# Patient Record
Sex: Female | Born: 1935 | Race: White | Hispanic: No | Smoking: Never smoker
Health system: Southern US, Community
[De-identification: ages and names within clinical notes are randomized; demographics above are authoritative.]

## PROBLEM LIST (undated history)

## (undated) DIAGNOSIS — M8580 Other specified disorders of bone density and structure, unspecified site: Secondary | ICD-10-CM

## (undated) DIAGNOSIS — K219 Gastro-esophageal reflux disease without esophagitis: Secondary | ICD-10-CM

## (undated) DIAGNOSIS — I10 Essential (primary) hypertension: Secondary | ICD-10-CM

## (undated) DIAGNOSIS — Z87442 Personal history of urinary calculi: Secondary | ICD-10-CM

## (undated) DIAGNOSIS — J189 Pneumonia, unspecified organism: Secondary | ICD-10-CM

## (undated) DIAGNOSIS — Z9889 Other specified postprocedural states: Secondary | ICD-10-CM

## (undated) DIAGNOSIS — R112 Nausea with vomiting, unspecified: Secondary | ICD-10-CM

## (undated) DIAGNOSIS — K30 Functional dyspepsia: Secondary | ICD-10-CM

## (undated) HISTORY — DX: Pneumonia, unspecified organism: J18.9

## (undated) HISTORY — PX: VAGINAL HYSTERECTOMY: SUR661

## (undated) HISTORY — PX: EYE SURGERY: SHX253

## (undated) HISTORY — DX: Personal history of urinary calculi: Z87.442

## (undated) HISTORY — PX: APPENDECTOMY: SHX54

## (undated) HISTORY — PX: CHOLECYSTECTOMY: SHX55

## (undated) HISTORY — DX: Other specified disorders of bone density and structure, unspecified site: M85.80

## (undated) HISTORY — PX: BLADDER SUSPENSION: SHX72

## (undated) HISTORY — PX: COLONOSCOPY: SHX174

## (undated) HISTORY — PX: WISDOM TOOTH EXTRACTION: SHX21

---

## 1998-11-11 ENCOUNTER — Other Ambulatory Visit: Admission: RE | Admit: 1998-11-11 | Discharge: 1998-11-11 | Payer: Self-pay | Admitting: Obstetrics and Gynecology

## 2001-12-19 ENCOUNTER — Other Ambulatory Visit: Admission: RE | Admit: 2001-12-19 | Discharge: 2001-12-19 | Payer: Self-pay | Admitting: Obstetrics and Gynecology

## 2002-05-29 ENCOUNTER — Ambulatory Visit (HOSPITAL_COMMUNITY): Admission: RE | Admit: 2002-05-29 | Discharge: 2002-05-29 | Payer: Self-pay | Admitting: Gastroenterology

## 2003-10-12 ENCOUNTER — Encounter: Admission: RE | Admit: 2003-10-12 | Discharge: 2003-10-12 | Payer: Self-pay | Admitting: Internal Medicine

## 2003-10-13 ENCOUNTER — Encounter: Admission: RE | Admit: 2003-10-13 | Discharge: 2003-10-13 | Payer: Self-pay | Admitting: Internal Medicine

## 2003-10-28 ENCOUNTER — Encounter (INDEPENDENT_AMBULATORY_CARE_PROVIDER_SITE_OTHER): Payer: Self-pay | Admitting: Specialist

## 2003-10-28 ENCOUNTER — Observation Stay (HOSPITAL_COMMUNITY): Admission: RE | Admit: 2003-10-28 | Discharge: 2003-10-28 | Payer: Self-pay | Admitting: Surgery

## 2005-03-21 ENCOUNTER — Ambulatory Visit (HOSPITAL_COMMUNITY): Admission: RE | Admit: 2005-03-21 | Discharge: 2005-03-21 | Payer: Self-pay | Admitting: Urology

## 2007-03-26 ENCOUNTER — Other Ambulatory Visit: Admission: RE | Admit: 2007-03-26 | Discharge: 2007-03-26 | Payer: Self-pay | Admitting: Obstetrics & Gynecology

## 2007-10-17 DIAGNOSIS — J189 Pneumonia, unspecified organism: Secondary | ICD-10-CM

## 2007-10-17 HISTORY — DX: Pneumonia, unspecified organism: J18.9

## 2008-06-16 DIAGNOSIS — M8580 Other specified disorders of bone density and structure, unspecified site: Secondary | ICD-10-CM

## 2008-06-16 HISTORY — DX: Other specified disorders of bone density and structure, unspecified site: M85.80

## 2011-03-03 NOTE — Op Note (Signed)
[name], [name]               ACCOUNT NO.:  [id number]   MEDICAL RECORD NO.:  [id number]          PATIENT TYPE:  AMB   LOCATION:  DAY                          FACILITY:  [facility]   PHYSICIAN:  Jamison Neighbor, M.D.  DATE OF BIRTH:  Jan 22, 1935   DATE OF PROCEDURE:  03/21/2005  DATE OF DISCHARGE:                                 OPERATIVE REPORT   PREOPERATIVE DIAGNOSES:  Prolapsing cystocele with possible stress urinary  incontinence.   POSTOPERATIVE DIAGNOSIS:  Prolapsing cystocele.   PROCEDURE:  Anterior repair of cystocele including mesh.   SECONDARY PROCEDURE:  Cystoscopy.   SURGEON:  Jamison Neighbor, M.D.   ANESTHESIA:  General.   COMPLICATIONS:  None.   DRAINS:  Foley catheter.   BRIEF HISTORY:  This 76 year old female has a prolapsing cystocele that  extends out beyond the introitus.  This has not been associated with any  stress urinary incontinence as it does appear that she has fairly good  urethral support;  however, there is certainly some concern that after the  cystocele is repaired, she may develop some leakage problems.  She does not  have a lot in the way of vault prolapse.  There is a modest rectocele and no  enterocele.  The patient has been seen and evaluated by GYN and was referred  for treatment of the cystocele with the thought that a mesh repair might  give her a better long-term result.  The patient is quite active, and all  effort needs to be made to give her as permanent a repair as possible  without any loss of vaginal structure.  After careful consideration, the  patient is to undergo mesh repair.  If it does appear that she may developed  some stress incontinence, she will have a sling placed at the same time.  She gave full informed consent.   DESCRIPTION OF PROCEDURE:  After successful induction of general anesthesia,  the patient was placed in the dorsal lithotomy position, prepped with  Betadine, and draped in the usual sterile fashion.   Careful examination  shows that she does have a central defect that is prolapsing out beyond the  introitus.  The vault appears to be well supported.  There is a modest  rectocele and no enterocele.  An incision was made in the anterior vaginal  mucosa beginning at the bladder neck and extending back to the cardinal  ligaments.  This was done after the entire area had been infiltrated with  Marcaine and epinephrine.  Flaps of mucosa were raised bilaterally extending  all the way back to the endopelvic fascia.  This was entered to allow  placement of the mesh system.  The central defect was then closed by pulling  the fascia across the midline with a series of mattress sutures.  The  patient had stab incisions made in the groin crease at the level of the  clitoris and then also approximately 3.5 cm inferiorly.  The four arms of  the Perigee mesh repair were then brought out through the small stab  incision by passing needles through the upper  and lower portion of the  obturator fossa.  Cystoscopy was then performed with a 12-degree and 70-  degree lenses.  No tumors or stones could be seen.  The bladder neck  actually appeared pretty well supported with good coaptation of the mucosa.  The patient had not be injured in any way by the passage of the needles.  The mesh was then attached, and all four wings were brought out, and this  very nicely elevated the cystocele.  The tail on the end was trimmed up  slightly and was attached down at the cardinal ligaments. Additional  redundancy was removed.  The mucosa was trimmed and then closed with a  series of 2-0 Vicryl sutures.  Several layers were done to make sure that  there was good coverage over the mesh.  The entire area was irrigated with  antibiotic solution.  The final inspection showed there appeared to good  support for the bladder.  This did not appear to be over-corrected.  There  was no angulation noted.  There was a flat, thin base  of the bladder.  There  was no sign of any mesh coming out through the incision.  There was a modest  rectocele, but not enough to warrant additional surgery.  There was no loss  of vaginal length and still good vaginal size.  The Foley catheter was left  in place.  Packing was inserted.  The four arms of the device were then  trimmed at the stab incisions, and they were then closed with Dermabond.  The patient tolerated the procedure and was taken to the recovery room in  good condition.  She will be sent home with a prescription for Cipro as well  as for Lorcet 10 and will return to see me in the office in two weeks' time.  If the patient is unable to urinate today, she will go home with a Foley  catheter in place.  The patient certainly will be advised that if additional  prolapse such as rectocele should occur, she will discuss this with Dr.  Tresa Res, and this can be repaired at a later data.  Should stress  incontinence occur at a later date, she is also a candidate for a simple  sling as well.  These, however, were not felt to be necessary today as a  site specific repair was felt to be most appropriate.     _______________    RJE/MEDQ  D:  03/21/2005  T:  03/21/2005  Job:  [id number]   cc:   Edwena Felty. Romine, M.D.  [address]  [place]  [place] [address]  Fax: [phone]   Thora Lance, M.D.  [address]  [place]  [place] [address]  Fax: [phone]

## 2011-03-03 NOTE — Op Note (Signed)
NAMEAMBERLE, [name]                         ACCOUNT NO.:  [id number]   MEDICAL RECORD NO.:  [id number]                   PATIENT TYPE:  AMB   LOCATION:  DAY                                  [facility]ACILITY[facility]  [facility]   PHYSICIAN:  Currie Paris, M.D.           DATE OF BIRTH:  Jul 04, 1935   DATE OF PROCEDURE:  10/27/2002  DATE OF DISCHARGE:                                 OPERATIVE REPORT   OFFICE MEDICAL RECORD NUMBER:  [id number]   PREOPERATIVE DIAGNOSIS:  Chronic calculus cholecystitis.   POSTOPERATIVE DIAGNOSIS:  Chronic calculus cholecystitis.   OPERATION:  Laparoscopic cholecystectomy with operative cholangiogram.   SURGEON:  Currie Paris, M.D.   ASSISTANT:  Dr. Ginette Pitman   ANESTHESIA:  General endotracheal.   CLINICAL HISTORY:  This patient is a 76 year old, been having intermittent  bouts of abdominal pain, including diarrhea with very foul-smelling stool.  She was initially thought not to have gallbladder problems but on further  evaluation, she was found to probably have had a small episode of  pancreatitis and gallstones noted as well.  After her CT scan, she developed  an allergic reaction about one week later which was thought possibly  secondary to her contrast, but that could not be conclusively shown, and she  resolved almost immediately on a short course of steroids.  She was admitted  electively for cholecystectomy.   DESCRIPTION OF PROCEDURE:  The patient seen in the holding area and had no  further questions.  She was taken to the operating room and after  satisfactory general endotracheal anesthesia had been obtained, the abdomen  was prepped and draped.  Marcaine 0.25% plain was used for each incision.  The umbilical incision made, the fascia opened, the peritoneal cavity  entered, and a pursestring suture placed.  The Hasson was introduced and the  abdomen insufflated to 15.  I attempted to visualize the ovaries, but I  could not get a good look at  those.  Preoperatively, there had been some  noted ovarian cysts, but these had been stable over a long period of time.   The patient was placed in reverse Trendelenburg and tilted to the left.  Additional trocars were placed with a 10-11 in the epigastrium and two 5s  laterally.  We had used those actually to try to visualize the pelvis.  The  patient then had the gallbladder grasped and retracted over the liver.  Once  the peritoneum over the common duct, cystic duct area was open, I could  identify the cystic duct and saw both the anterior and posterior branch of  the cystic artery and made a nice window in the triangle of Calot.  The  cystic duct was clipped at its junction with the gallbladder and opened.  An  operative cholangiography was done using a Cook catheter, and this appeared  to be normal, although there was such good flow into duodenum, we did not  get good back-filling into the hepatic radicles but did see both the right  and left branch.   The catheter was removed and the cystic duct clipped three times and  divided.  The two arteries were double clipped on the stay side and once on  the gallbladder side and divided.  The gallbladder was removed from below to  above, placed in a bag, and then brought out the umbilical port.  We  irrigated and made sure everything looked dry and suctioned out the  remaining irrigant.  The lateral ports were removed under direct vision.  The umbilical port was closed with a pursestring.  The abdomen was deflated  through the epigastric port.  Skin was closed with 4-0 Monocryl and  subcuticular Dermabond.   The patient tolerated the procedure well.  There were no operative  complications, and all counts were correct.                                               Currie Paris, M.D.    CJS/MEDQ  D:  10/28/2003  T:  10/28/2003  Job:  [id number]   cc:   Thora Lance, M.D.  [address]  [place]  [place] [address]   Fax: [phone]

## 2011-03-03 NOTE — Op Note (Signed)
   [name], [name]                         ACCOUNT NO.:  [id number]   MEDICAL RECORD NO.:  [id number]                   PATIENT TYPE:  AMB   LOCATION:  ENDO                                 FACILITY:  [facility]   PHYSICIAN:  Charolett Bumpers, M.D.             DATE OF BIRTH:  October 09, 1935   DATE OF PROCEDURE:  05/29/2002  DATE OF DISCHARGE:                                 OPERATIVE REPORT   PROCEDURE:  Screening colonoscopy.   REFERRING PHYSICIAN:  Thora Lance, M.D.   INDICATIONS FOR PROCEDURE:  Ms. [name] is a 76 year old female born  05-12-1935.  Ms. [name] underwent a colonoscopy performed by Dr. Sheryn Bison approximately five years ago.  Her father had colon cancer.   ENDOSCOPIST:  Charolett Bumpers, M.D.   PREMEDICATION:  Versed 10 mg, Fentanyl 50 mcg.   ENDOSCOPE:  Olympus pediatric colonoscope.   PROCEDURE:  After obtaining confirmed consent, Ms. [name] was placed in the  left lateral decubitus position.  I administered intravenous Fentanyl and  intravenous  Versed to achieve conscious sedation for the procedure.  The  patient's blood pressure, oxygen saturation, and cardiac rhythm were  monitored throughout the procedure and documented in the medical record.  Anal inspection was normal.  Digital rectal exam was normal.  The Olympus  pediatric video colonoscope was introduced into the rectum and easily  advanced to the cecum.  Colonic preparation for the exam today was  excellent.   Rectum normal.  Sigmoid colon and descending colon:  Left colonic diverticulosis.  Splenic flexure normal.  Transverse colon normal.  Hepatic flexure normal.  Ascending colon normal.  Cecum and ileocecal valve normal.   ASSESSMENT:  Left colonic diverticulosis, otherwise normal proctocolonoscopy  to the cecum.  No endoscopic evidence for the presence of colorectal  neoplasia.   RECOMMENDATIONS:  Consider repeat screening colonoscopy in approximately  five  years.                                              Charolett Bumpers, M.D.   MKJ/MEDQ  D:  05/29/2002  T:  05/30/2002  Job:  [id number]   cc:   Thora Lance, M.D.

## 2011-05-31 ENCOUNTER — Other Ambulatory Visit: Payer: Self-pay | Admitting: Internal Medicine

## 2011-05-31 DIAGNOSIS — G8929 Other chronic pain: Secondary | ICD-10-CM

## 2011-06-02 ENCOUNTER — Other Ambulatory Visit: Payer: Self-pay

## 2012-07-10 ENCOUNTER — Other Ambulatory Visit: Payer: Self-pay | Admitting: Internal Medicine

## 2012-10-24 ENCOUNTER — Other Ambulatory Visit: Payer: Self-pay | Admitting: *Deleted

## 2013-07-03 ENCOUNTER — Encounter: Payer: Self-pay | Admitting: Obstetrics & Gynecology

## 2013-07-23 ENCOUNTER — Other Ambulatory Visit (HOSPITAL_COMMUNITY): Payer: Self-pay

## 2013-07-24 ENCOUNTER — Other Ambulatory Visit (HOSPITAL_COMMUNITY): Payer: Self-pay

## 2013-08-08 ENCOUNTER — Other Ambulatory Visit (HOSPITAL_COMMUNITY): Payer: Self-pay

## 2013-09-04 ENCOUNTER — Encounter: Payer: Self-pay | Admitting: Obstetrics & Gynecology

## 2013-09-04 DIAGNOSIS — M8580 Other specified disorders of bone density and structure, unspecified site: Secondary | ICD-10-CM | POA: Insufficient documentation

## 2013-09-05 ENCOUNTER — Ambulatory Visit (INDEPENDENT_AMBULATORY_CARE_PROVIDER_SITE_OTHER): Payer: Medicare Other | Admitting: Obstetrics & Gynecology

## 2013-09-05 ENCOUNTER — Encounter: Payer: Self-pay | Admitting: Obstetrics & Gynecology

## 2013-09-05 VITALS — BP 122/80 | HR 60 | Resp 16 | Ht 67.25 in | Wt 159.0 lb

## 2013-09-05 DIAGNOSIS — R141 Gas pain: Secondary | ICD-10-CM

## 2013-09-05 DIAGNOSIS — R140 Abdominal distension (gaseous): Secondary | ICD-10-CM

## 2013-09-05 DIAGNOSIS — Z01419 Encounter for gynecological examination (general) (routine) without abnormal findings: Secondary | ICD-10-CM

## 2013-09-05 MED ORDER — VITAMIN D (ERGOCALCIFEROL) 1.25 MG (50000 UNIT) PO CAPS: 50000.0000 [IU] | ORAL_CAPSULE | ORAL | Status: DC

## 2013-09-05 NOTE — Progress Notes (Signed)
Patient ID: [name], female   DOB: Mar 12, 1935, 78 y.o.   MRN: [id number]

## 2013-09-05 NOTE — Progress Notes (Signed)
78 y.o. [id number] WWF here for annual exam.  Husband passed this past year.  Grieving appropriately.  Children are in [place], [place], and [place].  No vaginal bleeding.  Dr. Valentina Lucks is PCP.  Sees him once yearly.  Pt reports some change in bowel movements/habits over last year.  Also having some trouble with bloating.  Feels stress related.  PCP wants her to have a colonoscopy again.  She really doesn't want to do this.  Off HRT.  No LMP recorded. Patient has had a hysterectomy.          Sexually active: no  The current method of family planning is status post hysterectomy and post menopausal status.    Exercising: yes  Home exercise routine includes pilates and walk. Smoker:  no  Health Maintenance: Pap:  7/10, normal History of abnormal Pap:  no MMG:  10/14 Colonoscopy:  2008, [facility] BMD:   9/09, -1.7/-1.0 TDaP:  PCP Screening Labs: PCP, Hb today: PCP, Urine today: PCP   reports that she has never smoked. She does not have any smokeless tobacco history on file. She reports that she drinks about 1.5 ounces of alcohol per week. She reports that she does not use illicit drugs.  Past Medical History  Diagnosis Date  . Osteopenia 06/2008  . Pneumonia 2009    Past Surgical History  Procedure Laterality Date  . Appendectomy    . Cholecystectomy    . Bladder suspension    . Abdominal hysterectomy      TVH--still has ovaries    Current Outpatient Prescriptions  Medication Sig Dispense Refill  . CALCIUM PO Take by mouth. Patient takes [phone] mg      . losartan (COZAAR) 50 MG tablet Take 1 tablet by mouth daily.      . Multiple Vitamins-Minerals (MULTIVITAMIN PO) Take by mouth.      . Vitamin D, Ergocalciferol, (DRISDOL) 50000 UNITS CAPS capsule Take 50,000 Units by mouth once a week.       No current facility-administered medications for this visit.    Family History  Problem Relation Age of Onset  . Cancer Father     ?colon ca  . Diabetes Brother     ROS:   Pertinent items are noted in HPI.  Otherwise, a comprehensive ROS was negative.  Exam:   BP 122/80  Pulse 60  Resp 16  Ht 5' 7.25" (1.708 m)  Wt 159 lb (72.122 kg)  BMI 24.72 kg/m2  Weight change: @WEIGHTCHANGE @ Height:   Height: 5' 7.25" (170.8 cm)  Ht Readings from Last 3 Encounters:  09/05/13 5' 7.25" (1.708 m)    General appearance: alert, cooperative and appears stated age Head: Normocephalic, without obvious abnormality, atraumatic Neck: no adenopathy, supple, symmetrical, trachea midline and thyroid normal to inspection and palpation Lungs: clear to auscultation bilaterally Breasts: normal appearance, no masses or tenderness Heart: regular rate and rhythm Abdomen: soft, non-tender; bowel sounds normal; no masses,  no organomegaly Extremities: extremities normal, atraumatic, no cyanosis or edema Skin: Skin color, texture, turgor normal. No rashes or lesions Lymph nodes: Cervical, supraclavicular, and axillary nodes normal. No abnormal inguinal nodes palpated Neurologic: Grossly normal   Pelvic: External genitalia:  no lesions              Urethra:  normal appearing urethra with no masses, tenderness or lesions              Bartholins and Skenes: normal  Vagina: normal appearing vagina with normal color and discharge, no lesions, small rectocele present              Cervix: absent              Pap taken: no Bimanual Exam:  Uterus:  uterus absent              Adnexa: normal adnexa and no mass, fullness, tenderness               Rectovaginal: Confirms               Anus:  normal sphincter tone, no lesions  A:  Well Woman with normal exam Vit D deficiency Small rectocele Appropriate grief reaction after death of spouse PMP, off HRT Osteopenia  P:   Mammogram yearly pap smear not obtained Return for PUS Vit D 50K every other week.  Rx to pharmacy. return annually or prn  An After Visit Summary was printed and given to the patient.

## 2013-09-05 NOTE — Patient Instructions (Signed)

## 2013-09-17 ENCOUNTER — Telehealth: Payer: Self-pay | Admitting: Obstetrics & Gynecology

## 2013-09-17 NOTE — Telephone Encounter (Signed)
Advised patient of the quote of benefits we received from [employer]. Patient is agreeable.

## 2013-09-17 NOTE — Telephone Encounter (Signed)
Call to patient and offered to  scheduled PUS even though insurance benefits have  not yet been checked.  Patient is unavailable every Thursday afternoon so insistent on morning appointment. Advised we will make an exception and schedule in another provider slot to accommodate. PUS 10-02-13 at 0930.  Routing to provider for final review. Patient agreeable to disposition. Will close encounter

## 2013-09-17 NOTE — Telephone Encounter (Signed)
Patient was in on the 21st of november and said she was told she would get a call about scheduling a ultrasound. She hasnt heard yet and wanted someone to call her

## 2013-10-02 ENCOUNTER — Ambulatory Visit (INDEPENDENT_AMBULATORY_CARE_PROVIDER_SITE_OTHER): Payer: Medicare Other

## 2013-10-02 ENCOUNTER — Encounter: Payer: Self-pay | Admitting: Obstetrics & Gynecology

## 2013-10-02 ENCOUNTER — Ambulatory Visit (INDEPENDENT_AMBULATORY_CARE_PROVIDER_SITE_OTHER): Payer: Medicare Other | Admitting: Obstetrics & Gynecology

## 2013-10-02 VITALS — BP 138/84 | HR 64 | Ht 66.25 in | Wt 160.5 lb

## 2013-10-02 DIAGNOSIS — N83209 Unspecified ovarian cyst, unspecified side: Secondary | ICD-10-CM

## 2013-10-02 DIAGNOSIS — N839 Noninflammatory disorder of ovary, fallopian tube and broad ligament, unspecified: Secondary | ICD-10-CM

## 2013-10-02 DIAGNOSIS — R141 Gas pain: Secondary | ICD-10-CM

## 2013-10-02 DIAGNOSIS — R140 Abdominal distension (gaseous): Secondary | ICD-10-CM

## 2013-10-02 DIAGNOSIS — N838 Other noninflammatory disorders of ovary, fallopian tube and broad ligament: Secondary | ICD-10-CM

## 2013-10-02 NOTE — Progress Notes (Signed)
78 y.o.Marriedfemale here for a pelvic ultrasound.  Having bloating symptoms which she feels are maybe stress related.  PUS recommended and she is here for this today.  H/O TVH many years ago.  Wants to discuss her insomnia.  Has tried multiple OTC products as well as prescription medications without success.  They all seem to "zip me up" and make her more aware and agitated.  Doesn't really want my recommendation just wants to talk about it.  She has been on several different medications.    Another thing she wants to discuss is her "chocolate pudding" bowel movements.  She has had this issue for years.  Colonoscopy was 2008 at [facility].  Did try Align and had some improvement but then went the other way with constipation.  She has stopped that now.  No LMP recorded. Patient has had a hysterectomy.  Sexually active:  No, widowed  Contraception: hysterectomy  FINDINGS: UTERUS: surgically absent EMS: n/a ADNEXA:   Left ovary 0.9 x 1.0 x 2.0cm with 2.0 thin-walled cyst with low level echoes, avascular   Right ovary 2.0 x 0.8 x 0.9cm with 4.1 cm cyst containing thin septation but avascular CUL DE SAC: neg for free fluid  Images reviewed and findings discussed.  Bilateral cysts do appear benign and overall not concerning for cancer.  Discussed with pt differing guidelines for ovary removal based on size.  I feel very safe to watch this and would recommend repeat PUS 3-4 months.  Removal via laparoscopy also discussed.  She wants to consider options and will let me know.    Assessment:  Bilateral ovarian cysts, right 4cm, concerns regarding consistency of bowel movements. Plan: conservative follow up with repeat PUS 3-4 months vs removal now. Recommended pt follow up with GI regarding stool consistency concerns.   Insomnia.  Dr. Valentina Lucks has given several sleep meds that make her "zipped up" "chocolate pudding" bowel movements [employer]

## 2013-10-03 LAB — CA 125: CA 125: 9.1 U/mL (ref 0.0–30.2)

## 2013-10-14 ENCOUNTER — Telehealth: Payer: Self-pay | Admitting: Obstetrics & Gynecology

## 2013-10-14 NOTE — Telephone Encounter (Signed)
Message left to return call to [employer] at [phone].   Appears that patient has received a phone call from [facility] 20 minutes after she called in. Returned call and left message to call back if needed further assistance.

## 2013-10-14 NOTE — Telephone Encounter (Signed)
Patient is calling about results from blood work from appt on 10/02/13 wants to be called back about it.

## 2013-10-15 ENCOUNTER — Telehealth: Payer: Self-pay | Admitting: *Deleted

## 2013-10-15 DIAGNOSIS — N83209 Unspecified ovarian cyst, unspecified side: Secondary | ICD-10-CM | POA: Insufficient documentation

## 2013-10-15 NOTE — Telephone Encounter (Signed)
Call to patient as follow-up with patient on her preference for surgery. Patient states she sees no real advantage to keeping ovaries and wished to proceed with scheduling surgery at first available date. Will schedule and call her back.

## 2013-10-15 NOTE — Telephone Encounter (Signed)
Message copied by Alisa Graff on Wed Oct 15, 2013 11:50 AM ------      Message from: Jerene Bears      Created: Wed Oct 15, 2013  7:34 AM       Pt has a 4cm ovarian cyst and h/o TVH.  May want ovaries out.  Can you call?  Ok to schedule.  I would prefer with Dr. Edward Jolly as assistant.  I can do this on a Tues if ok with Dr. Edward Jolly.            CC: Lorrene Reid ------

## 2013-10-17 NOTE — Telephone Encounter (Signed)
Patient calling back today regarding date for surgery. States after talking with multiple family members, she thinks she may be jumping to surgery too fast and she has decided she will wait till the repeat PUS in march and then decide if wants to have surgery.  PUS is already scheduled for 01-01-14.  Surgery was also already scheduled for 11-03-13 as patient requested on 10-15-13 so will notify hospital to cancel case. Beverly at [place] scheduling notified to cancel case.  Routing to provider for final review. Patient agreeable to disposition. Will close encounter

## 2013-11-03 ENCOUNTER — Encounter (HOSPITAL_COMMUNITY): Admission: RE | Payer: Self-pay | Source: Ambulatory Visit

## 2013-11-03 ENCOUNTER — Ambulatory Visit (HOSPITAL_COMMUNITY)
Admission: RE | Admit: 2013-11-03 | Payer: Medicare Other | Source: Ambulatory Visit | Admitting: Obstetrics & Gynecology

## 2013-11-03 SURGERY — SALPINGO-OOPHORECTOMY, BILATERAL, LAPAROSCOPIC
Anesthesia: General | Laterality: Bilateral

## 2013-11-10 ENCOUNTER — Telehealth: Payer: Self-pay | Admitting: Obstetrics & Gynecology

## 2013-11-10 DIAGNOSIS — N838 Other noninflammatory disorders of ovary, fallopian tube and broad ligament: Secondary | ICD-10-CM

## 2013-11-10 NOTE — Telephone Encounter (Signed)
Routing to Dr. Sabra Heck for review for patient request for second opinion.    Last visit notes from Dr. Sabra Heck 10/02/13:  Images reviewed and findings discussed. Bilateral cysts do appear benign and overall not concerning for cancer. Discussed with pt differing guidelines for ovary removal based on size. I feel very safe to watch this and would recommend repeat PUS 3-4 months. Removal via laparoscopy also discussed. She wants to consider options and will let me know.

## 2013-11-10 NOTE — Telephone Encounter (Signed)
Spoke with pt.  She doesn't really want surgery but this is worrying her so she feels she needs a second opinion.  Please send to Dr. Fermin Schwab or Dr. Hale Bogus appt isn't weeks and weeks away.  Order placed.  For patient's schedule--Mon, Tue, Thurs afternoons are bad.  Almost all mornings are fine.    CC: [name]

## 2013-11-10 NOTE — Telephone Encounter (Signed)
Patient said she had a scan in December for ovarian cyst and doesn't want to wait until march to get checked again wants to know where dr [name] would recommend to go and get a second opinion.

## 2013-11-11 NOTE — Telephone Encounter (Signed)
Received call from [facility] at [place]. Dr. Alycia Rossetti is in office today. She will have Dr. Alycia Rossetti review and call back with appointment or Dr. Elenora Gamma recommendation.

## 2013-11-11 NOTE — Telephone Encounter (Signed)
Left message [facility] at [phone] for appointment.

## 2013-11-12 NOTE — Telephone Encounter (Signed)
Melissa from Dr. Creed Copper office called and advised that Dr. Alycia Rossetti has done chart review and agrees with Dr. Ammie Ferrier recommendations. Patient may schedule appointment for second opinion if she would like to.  Called and spoke with patient. I gave her the message from Dr. Creed Copper office. Patient states she is agreeable then to both Dr. Sabra Heck and Dr. Creed Copper opinon. Patient has PUS scheduled for 3/19 and was reminded of appointment. Patient states that she has some new bloating, wondering if it may be from ovarian cyst or from recent constipation. Patient will call back if worsens.  Routing to Dr. Sabra Heck for final review.

## 2013-11-25 ENCOUNTER — Telehealth: Payer: Self-pay | Admitting: Obstetrics & Gynecology

## 2013-11-25 DIAGNOSIS — N83209 Unspecified ovarian cyst, unspecified side: Secondary | ICD-10-CM

## 2013-11-25 DIAGNOSIS — N838 Other noninflammatory disorders of ovary, fallopian tube and broad ligament: Secondary | ICD-10-CM

## 2013-11-25 NOTE — Telephone Encounter (Signed)
Spoke with patient. She is requesting Dr. Ammie Ferrier opinion on possibly moving her ultrasound earlier and not waiting until scheduled for 01/01/14.  She states that she is having the opportunity to travel to [place] on 02/13/14 and she would like to go but does not want to have issues with cyst while traveling or if she needs to have surgery would like to have done earlier rather than later.  Advised I would send a message to Dr. Sabra Heck to get her opinion. Patient declined office visit at this time to discuss with Dr. Sabra Heck. Advised I would call her back with message.

## 2013-11-25 NOTE — Telephone Encounter (Signed)
This is fine.  Would 2 or 3 weeks earlier help?  If needed surgery, would schedule within 2-3 weeks and recovery would not be anymore than a month.

## 2013-11-25 NOTE — Telephone Encounter (Signed)
Message left to return call to Redford Behrle at [phone].    

## 2013-11-25 NOTE — Telephone Encounter (Signed)
Pt would like to speak with nurse regarding the ovarian cysts that she have.

## 2013-11-26 NOTE — Telephone Encounter (Signed)
Spoke with patient, she will call back when she has her calendar in hand to schedule new u/s appt.

## 2013-11-26 NOTE — Telephone Encounter (Signed)
Patient called back. R/s U/S appointment to 12/11/13. Pelvic U/S scheduled and patient aware/agreeable to time.  Patient verbalized understanding of the U/S appointment cancellation policy. Advised will need to cancel within 72 business hours (3 business days) or will have $100.00 no show fee placed to account.   Encounter closed.

## 2013-12-11 ENCOUNTER — Telehealth: Payer: Self-pay | Admitting: Obstetrics & Gynecology

## 2013-12-11 ENCOUNTER — Other Ambulatory Visit: Payer: Medicare Other

## 2013-12-11 ENCOUNTER — Other Ambulatory Visit: Payer: Medicare Other | Admitting: Obstetrics & Gynecology

## 2013-12-12 NOTE — Telephone Encounter (Signed)
Note not needed 

## 2013-12-16 ENCOUNTER — Other Ambulatory Visit: Payer: Self-pay | Admitting: *Deleted

## 2013-12-16 DIAGNOSIS — N838 Other noninflammatory disorders of ovary, fallopian tube and broad ligament: Secondary | ICD-10-CM

## 2013-12-18 ENCOUNTER — Encounter: Payer: Self-pay | Admitting: Obstetrics and Gynecology

## 2013-12-18 ENCOUNTER — Ambulatory Visit (INDEPENDENT_AMBULATORY_CARE_PROVIDER_SITE_OTHER): Payer: Medicare Other

## 2013-12-18 ENCOUNTER — Ambulatory Visit (INDEPENDENT_AMBULATORY_CARE_PROVIDER_SITE_OTHER): Payer: Medicare Other | Admitting: Obstetrics and Gynecology

## 2013-12-18 VITALS — BP 140/86 | HR 70 | Ht 66.25 in | Wt 161.6 lb

## 2013-12-18 DIAGNOSIS — N83209 Unspecified ovarian cyst, unspecified side: Secondary | ICD-10-CM

## 2013-12-18 DIAGNOSIS — N839 Noninflammatory disorder of ovary, fallopian tube and broad ligament, unspecified: Secondary | ICD-10-CM

## 2013-12-18 DIAGNOSIS — N83201 Unspecified ovarian cyst, right side: Secondary | ICD-10-CM

## 2013-12-18 DIAGNOSIS — N83202 Unspecified ovarian cyst, left side: Principal | ICD-10-CM

## 2013-12-18 DIAGNOSIS — N838 Other noninflammatory disorders of ovary, fallopian tube and broad ligament: Secondary | ICD-10-CM

## 2013-12-18 NOTE — Progress Notes (Signed)
Patient ID: [name], female   DOB: [birth year], 79 y.o.   MRN: [id number] GYNECOLOGY  VISIT   HPI: 79 y.o.   Married  Caucasian  female   [phone] with No LMP recorded. Patient has had a hysterectomy.   here for  Pelvic ultrasound for follow up of bilateral ovarian cysts. CA125 10/02/13 - 9.1  Patient is traveling to [place] in May for one month and wants to know that the cysts won't rupture while she is traveling.   GYNECOLOGIC HISTORY: No LMP recorded. Patient has had a hysterectomy. Contraception:  hysterectomy Menopausal hormone therapy: none        OB History   Grav Para Term Preterm Abortions TAB SAB Ect Mult Living   3 3 3       3          Patient Active Problem List   Diagnosis Date Noted  . Other and unspecified ovarian cyst 10/15/2013  . Osteopenia     Past Medical History  Diagnosis Date  . Osteopenia 06/2008  . Pneumonia 2009    Past Surgical History  Procedure Laterality Date  . Appendectomy    . Cholecystectomy    . Bladder suspension    . Abdominal hysterectomy      TVH--still has ovaries    Current Outpatient Prescriptions  Medication Sig Dispense Refill  . CALCIUM PO Take by mouth. Patient takes [phone] mg      . losartan (COZAAR) 50 MG tablet Take 1 tablet by mouth daily.      . Multiple Vitamins-Minerals (MULTIVITAMIN PO) Take by mouth.      . Vitamin D, Ergocalciferol, (DRISDOL) 50000 UNITS CAPS capsule Take 1 capsule (50,000 Units total) by mouth every 14 (fourteen) days.  6 capsule  4   No current facility-administered medications for this visit.     ALLERGIES: Review of patient's allergies indicates no known allergies.  Family History  Problem Relation Age of Onset  . Cancer Father     ?colon ca  . Diabetes Brother     History   Social History  . Marital Status: Married    Spouse Name: N/A    Number of Children: N/A  . Years of Education: N/A   Occupational History  . Not on file.   Social History Main Topics  . Smoking  status: Never Smoker   . Smokeless tobacco: Not on file  . Alcohol Use: 1.5 oz/week    3 drink(s) per week  . Drug Use: No  . Sexual Activity: No     Comment: TVH   Other Topics Concern  . Not on file   Social History Narrative  . No narrative on file    ROS:  Pertinent items are noted in HPI.  PHYSICAL EXAMINATION:    BP 140/86  Pulse 70  Ht 5' 6.25" (1.683 m)  Wt 161 lb 9.6 oz (73.301 kg)  BMI 25.88 kg/m2     General appearance: alert, cooperative and appears stated age   Ultrasound - absent uterus.  Right ovarian tissue not visualized.  Patient with difficulty with exam.  Bowel noted in the region.  Right ovarian cyst 50 x 45 x 43 mm - mean 46 mm (previously 41 mm mean on 10/02/13).  Left ovarian tissue not visualized on transvaginal exam but seen transabdominally 21 x 18 x 15 mm - mean 18 mm (no real change since 10/02/13).  Images reviewed from 10/02/13 as well with patient. Right ovary with thin septation.  Normal dopplers at that time.   CT of pelvis in 2004 - 1.3 cm simple right ovarian cyst and 1.4 cm simple left ovarian cyst.   ASSESSMENT  Bilateral ovarian cysts.  Minimal change in the growth of the right ovarian cyst.  I suspect the patient may have cystadenomas.  PLAN  I discussed observation versus laparoscopic bilateral salpingo-oophorectomy.  Patient prefers to do observational management.   I recommend repeat ultrasound and consultation in 3 months.  Counseled on signs and symptoms of torsion.   An After Visit Summary was printed and given to the patient.  _15_____ minutes face to face time of which over 50% was spent in counseling.

## 2013-12-18 NOTE — Patient Instructions (Signed)
Ovarian Torsion  The ovaries are female reproductive organs that produce eggs. Ovarian torsion is when an ovary becomes twisted and cuts off its own blood supply. This can occur at any age. If an ovary is twisted, it cannot get blood and the ovary swells. It is a painful medical emergency. It must be treated quickly. If too much time has past, blood flow to the ovary may not be restored and the ovary may have to be removed.  CAUSES  Torsion can happen in an ovary that is normal size. However, most of the time it occurs in an ovary that is enlarged. An ovary can become enlarged because of:  · Harmless (benign) tumors on the ovaries.  · Cancerous tumors.  · Ovarian cysts, which are fluid-filled sacs.  · Normal pregnancy.  · A pregnancy that occurs outside the uterus (ectopic pregnancy).  RISK FACTORS  Risk factors are things that increase the likelihood of this condition happening. The risk factors include:  · Having fallopian tubes that are longer than normal.  · Having ovaries that are larger than normal.  · Taking fertility medicine to become pregnant.  · Having had surgery in the pelvic area.  SYMPTOMS  · Sudden pain in the lower abdomen, usually on one side only.  · Pelvic pain that starts after exercise.  · Pelvic pain that gets worse over time.  · Severe pelvic pain that comes and goes.  · Pelvic pain that spreads into the lower back or thigh.  · Nausea and vomiting along with pelvic pain.  DIAGNOSIS  Your caregiver will take a history and perform a physical exam. He or she may be able to feel an enlarged ovary. Your caregiver may order some further tests, which include:  · A pregnancy test.  · Imaging tests, such as pelvic Doppler ultrasonography, that measures blood flow, CT scan, or MRI.  Your caregiver may also perform a diagnositic laparoscopic exam. A small surgical cut (incision) will be made in your abdomen. Then, a small lighted telescope is put through the opening. This allows your caregiver to  clearly see your ovary and fallopian tube.   TREATMENT  Surgery is needed when an ovary becomes twisted. It is best to do this 8 hours or less after the ovary becomes twisted. Laparoscopic ovarian torsion surgery is done to try to untwist the ovary. Sometimes, a large incision has to be made in the abdomen (laparotomy) to relieve the ovary. If the ovary cannot be untwisted, the ovary will have to be surgically removed during a procedure called salpingo-oophorectomy.  Document Released: 09/21/2011 Document Revised: 12/25/2011 Document Reviewed: 09/21/2011  ExitCare® Patient Information ©2014 [employer].

## 2013-12-18 NOTE — Progress Notes (Signed)
Ultrasound reviewed.

## 2014-01-01 ENCOUNTER — Other Ambulatory Visit: Payer: Medicare Other | Admitting: Obstetrics & Gynecology

## 2014-01-01 ENCOUNTER — Other Ambulatory Visit: Payer: Medicare Other

## 2014-01-01 ENCOUNTER — Telehealth: Payer: Self-pay | Admitting: Obstetrics and Gynecology

## 2014-01-01 NOTE — Telephone Encounter (Signed)
Patient calling to scheduled June PUS. Scheduled for 6/18 with Dr. Sabra Heck. Pelvic U/S scheduled and patient aware/agreeable to time.  Patient verbalized understanding of the U/S appointment cancellation policy. Advised will need to cancel within 72 business hours (3 business days) or will have $100.00 no show fee placed to account.     Routing to provider for final review. Patient agreeable to disposition. Will close encounter

## 2014-01-01 NOTE — Telephone Encounter (Signed)
Pt calling to schedule a procedure. Not sure what the name of it is.

## 2014-01-02 ENCOUNTER — Telehealth: Payer: Self-pay | Admitting: Obstetrics & Gynecology

## 2014-01-02 NOTE — Telephone Encounter (Signed)
Called patient. Rescheduled PUS for 06.04.2015. Advised patient of cancellation policy and cancellation fee. Patient agreeable.

## 2014-01-02 NOTE — Telephone Encounter (Signed)
Patient cancelled her ultrasound appointment for 04/02/14. She is hoping to reschedule for 03/19/14 if possible.

## 2014-03-19 ENCOUNTER — Ambulatory Visit (INDEPENDENT_AMBULATORY_CARE_PROVIDER_SITE_OTHER): Payer: Medicare Other

## 2014-03-19 ENCOUNTER — Ambulatory Visit (INDEPENDENT_AMBULATORY_CARE_PROVIDER_SITE_OTHER): Payer: Medicare Other | Admitting: Obstetrics & Gynecology

## 2014-03-19 VITALS — BP 118/82 | Ht 66.25 in | Wt 161.0 lb

## 2014-03-19 DIAGNOSIS — N83202 Unspecified ovarian cyst, left side: Principal | ICD-10-CM

## 2014-03-19 DIAGNOSIS — N83209 Unspecified ovarian cyst, unspecified side: Secondary | ICD-10-CM

## 2014-03-19 DIAGNOSIS — N83201 Unspecified ovarian cyst, right side: Secondary | ICD-10-CM

## 2014-03-19 NOTE — Progress Notes (Signed)
Pt needed to leave before being seen.  Seen phone note.

## 2014-03-27 ENCOUNTER — Telehealth: Payer: Self-pay | Admitting: Obstetrics & Gynecology

## 2014-03-27 DIAGNOSIS — N838 Other noninflammatory disorders of ovary, fallopian tube and broad ligament: Secondary | ICD-10-CM

## 2014-03-27 NOTE — Telephone Encounter (Signed)
Spoke with patient. Advised would send a message over to [place] regarding ultrasound results and phone call request. Advised Dr.Miller is currently seeing patient's but I would send her a message and as soon as she has availability she will reach out to patient to discuss results. Patient states "That is fine. There is no rush at all."

## 2014-03-27 NOTE — Telephone Encounter (Signed)
Pt says she was calling to speak with Dr Sabra Heck about u/s she had on 6/4. She had to leave and was not able to talk to Dr.

## 2014-04-02 ENCOUNTER — Other Ambulatory Visit: Payer: Medicare Other

## 2014-04-02 ENCOUNTER — Other Ambulatory Visit: Payer: Self-pay | Admitting: Orthopedic Surgery

## 2014-04-02 ENCOUNTER — Other Ambulatory Visit: Payer: Medicare Other | Admitting: Obstetrics & Gynecology

## 2014-04-02 DIAGNOSIS — M25562 Pain in left knee: Secondary | ICD-10-CM

## 2014-04-06 ENCOUNTER — Encounter: Payer: Self-pay | Admitting: Obstetrics & Gynecology

## 2014-04-08 NOTE — Telephone Encounter (Signed)
Left message to call Kaitlyn at [phone]. 

## 2014-04-08 NOTE — Telephone Encounter (Signed)
I spoke with pt personally last week about ultrasound.  I didn't document conversation then so am doing in note now.  Still have cystic but benign area on ovary.  Not signficiatnly changing.  Will repeat PUS in 6 months.  I just placed order.  Pt aware office will call to schedule.  Can you please take care of that and let pt know about appt?  Thanks.  When complete, can close encounter.

## 2014-04-10 NOTE — Telephone Encounter (Signed)
Left message to call Kaitlyn at [phone]. 

## 2014-04-14 NOTE — Telephone Encounter (Signed)
Left message to call Kaitlyn at [phone]. 

## 2014-04-16 NOTE — Telephone Encounter (Signed)
Spoke with patient. Patient states that she is out of town and in the grocery store. Patient would like return call Monday to set up appointment as she will have her calendar with her at that time. Advised would give patient a call back on Monday to get her scheduled for PUS in December. Patient agreeable.

## 2014-04-20 NOTE — Telephone Encounter (Signed)
Spoke with patient. Six month follow up ultrasound scheduled for December 3rd at 12:30pm with 1300 consult with Dr.Miller. Patient agreeable to date and time.  Routing to provider for final review. Patient agreeable to disposition. Will close encounter

## 2014-04-21 ENCOUNTER — Ambulatory Visit
Admission: RE | Admit: 2014-04-21 | Discharge: 2014-04-21 | Disposition: A | Discharge status: Home / Self Care | Payer: Medicare Other | Source: Ambulatory Visit | Attending: Orthopedic Surgery | Admitting: Orthopedic Surgery

## 2014-04-21 DIAGNOSIS — M25562 Pain in left knee: Secondary | ICD-10-CM

## 2014-04-27 ENCOUNTER — Other Ambulatory Visit (HOSPITAL_COMMUNITY): Payer: Self-pay | Admitting: Orthopedic Surgery

## 2014-05-04 ENCOUNTER — Encounter (HOSPITAL_COMMUNITY): Payer: Self-pay | Admitting: Pharmacy Technician

## 2014-05-06 NOTE — Pre-Procedure Instructions (Signed)
SERRIA SLOMA  05/06/2014   Your procedure is scheduled on:  Wednesday, May 13, 2014  Report to [facility] Admitting at 7:45 AM.  Call this number if you have problems the morning of surgery: [phone]   Remember:   Do not eat food or drink liquids after midnight Tuesday, May 12, 2014   Take these medicines the morning of surgery with A SIP OF WATER: None Stop taking Aspirin, vitamins, and herbal medications. Do not take any NSAIDs ie: Ibuprofen, Advil, Naproxen or any medication containing Aspirin.  Do not wear jewelry, make-up or nail polish.  Do not wear lotions, powders, or perfumes. You may wear deodorant.  Do not shave 48 hours prior to surgery.   Do not bring valuables to the hospital.  [facility] is not responsible for any belongings or valuables.               Contacts, dentures or bridgework may not be worn into surgery.  Leave suitcase in the car. After surgery it may be brought to your room.  For patients admitted to the hospital, discharge time is determined by your treatment team.               Patients discharged the day of surgery will not be allowed to drive home.  Name and phone number of your driver:   Special Instructions:  Special Instructions:Special Instructions: [facility] - Preparing for Surgery  Before surgery, you can play an important role.  Because skin is not sterile, your skin needs to be as free of germs as possible.  You can reduce the number of germs on you skin by washing with CHG (chlorahexidine gluconate) soap before surgery.  CHG is an antiseptic cleaner which kills germs and bonds with the skin to continue killing germs even after washing.  Please DO NOT use if you have an allergy to CHG or antibacterial soaps.  If your skin becomes reddened/irritated stop using the CHG and inform your nurse when you arrive at Short Stay.  Do not shave (including legs and underarms) for at least 48 hours prior to the first CHG shower.  You may  shave your face.  Please follow these instructions carefully:   1.  Shower with CHG Soap the night before surgery and the morning of Surgery.  2.  If you choose to wash your hair, wash your hair first as usual with your normal shampoo.  3.  After you shampoo, rinse your hair and body thoroughly to remove the Shampoo.  4.  Use CHG as you would any other liquid soap.  You can apply chg directly  to the skin and wash gently with scrungie or a clean washcloth.  5.  Apply the CHG Soap to your body ONLY FROM THE NECK DOWN.  Do not use on open wounds or open sores.  Avoid contact with your eyes, ears, mouth and genitals (private parts).  Wash genitals (private parts) with your normal soap.  6.  Wash thoroughly, paying special attention to the area where your surgery will be performed.  7.  Thoroughly rinse your body with warm water from the neck down.  8.  DO NOT shower/wash with your normal soap after using and rinsing off the CHG Soap.  9.  Pat yourself dry with a clean towel.            10.  Wear clean pajamas.            11.  Place clean sheets on your bed the night of your first shower and do not sleep with pets.  Day of Surgery  Do not apply any lotions the morning of surgery.  Please wear clean clothes to the hospital/surgery center.   Please read over the following fact sheets that you were given: Pain Booklet, Coughing and Deep Breathing and Surgical Site Infection Prevention

## 2014-05-07 ENCOUNTER — Encounter (HOSPITAL_COMMUNITY)
Admission: RE | Admit: 2014-05-07 | Discharge: 2014-05-07 | Disposition: A | Discharge status: Home / Self Care | Payer: Medicare Other | Source: Ambulatory Visit | Attending: Orthopedic Surgery | Admitting: Orthopedic Surgery

## 2014-05-07 ENCOUNTER — Encounter (HOSPITAL_COMMUNITY): Payer: Self-pay

## 2014-05-07 DIAGNOSIS — Z01818 Encounter for other preprocedural examination: Secondary | ICD-10-CM | POA: Insufficient documentation

## 2014-05-07 DIAGNOSIS — Z01812 Encounter for preprocedural laboratory examination: Secondary | ICD-10-CM | POA: Diagnosis not present

## 2014-05-07 DIAGNOSIS — Z01810 Encounter for preprocedural cardiovascular examination: Secondary | ICD-10-CM | POA: Diagnosis not present

## 2014-05-07 HISTORY — DX: Other specified postprocedural states: Z98.890

## 2014-05-07 HISTORY — DX: Functional dyspepsia: K30

## 2014-05-07 HISTORY — DX: Essential (primary) hypertension: I10

## 2014-05-07 HISTORY — DX: Nausea with vomiting, unspecified: R11.2

## 2014-05-07 LAB — BASIC METABOLIC PANEL
ANION GAP: 10 (ref 5–15)
BUN: 16 mg/dL (ref 6–23)
CALCIUM: 9.3 mg/dL (ref 8.4–10.5)
CO2: 29 mEq/L (ref 19–32)
Chloride: 103 mEq/L (ref 96–112)
Creatinine, Ser: 0.69 mg/dL (ref 0.50–1.10)
GFR calc non Af Amer: 82 mL/min — ABNORMAL LOW (ref >90)
Glucose, Bld: 78 mg/dL (ref 70–99)
Potassium: 3.9 mEq/L (ref 3.7–5.3)
Sodium: 142 mEq/L (ref 137–147)

## 2014-05-07 LAB — CBC
HEMATOCRIT: 37.9 % (ref 36.0–46.0)
HEMOGLOBIN: 12.4 g/dL (ref 12.0–15.0)
MCH: 31.8 pg (ref 26.0–34.0)
MCHC: 32.7 g/dL (ref 30.0–36.0)
MCV: 97.2 fL (ref 78.0–100.0)
Platelets: 183 10*3/uL (ref 150–400)
RBC: 3.9 MIL/uL (ref 3.87–5.11)
RDW: 13.2 % (ref 11.5–15.5)
WBC: 5.8 10*3/uL (ref 4.0–10.5)

## 2014-05-07 NOTE — Progress Notes (Signed)
Pt. Unsure if she has had EKG or when if she has at Dr. Delene Ruffini office. Pt. Denies any chest pain, SOB,; states she feels well.

## 2014-05-08 ENCOUNTER — Encounter (HOSPITAL_COMMUNITY): Payer: Self-pay | Admitting: Vascular Surgery

## 2014-05-08 ENCOUNTER — Encounter (HOSPITAL_COMMUNITY): Payer: Self-pay

## 2014-05-08 NOTE — Progress Notes (Signed)
Anesthesia Chart Review:  Patient is a 79 year old female scheduled for left knee arthroscopy and medial meniscal debridement on 05/13/14 by Dr. Sharol Given.  History includes non-smoker, HTN, PNA '09, osteopenia, mild dietary indigestion (takes Pepcid), appendectomy, cholecystectomy, hysterectomy, bladder suspension.  PCP is Dr. Lavone Orn.  Her HR was documented as 118 bpm at PAT, but was down to 73 bpm for her EKG.  BP 122/70.  Meds: Cozaar, Pepcid, Vitamin D, MVI, Calcium.  EKG on 05/07/14 showed: NSR, T wave abnormality, consider anterior ischemia.  T wave abnormality appears new when compared to prior EKGs on 03/16/05 and 10/26/03 ([facility]).  Preoperative CXR and labs noted.   History and EKGs reviewed with anesthesiologist Dr. Albertina Parr.  With new T wave inversion, pre-operative cardiology consultation is recommended.  I notified Patty at Dr. Jess Barters office who will notify Dr. Sharol Given and/or his OR scheduler.  George Hugh [facility] Short Stay Center/Anesthesiology Phone [phone] 05/08/2014 2:31 PM

## 2014-05-13 ENCOUNTER — Encounter (HOSPITAL_COMMUNITY): Admission: RE | Payer: Self-pay | Source: Ambulatory Visit

## 2014-05-13 ENCOUNTER — Ambulatory Visit (HOSPITAL_COMMUNITY): Admission: RE | Admit: 2014-05-13 | Payer: Medicare Other | Source: Ambulatory Visit | Admitting: Orthopedic Surgery

## 2014-05-13 SURGERY — ARTHROSCOPY, KNEE
Anesthesia: General | Site: Knee | Laterality: Left

## 2014-05-18 ENCOUNTER — Encounter (HOSPITAL_COMMUNITY): Payer: Self-pay | Admitting: *Deleted

## 2014-05-18 ENCOUNTER — Encounter: Payer: Self-pay | Admitting: Cardiology

## 2014-05-18 ENCOUNTER — Ambulatory Visit (INDEPENDENT_AMBULATORY_CARE_PROVIDER_SITE_OTHER): Payer: Medicare Other | Admitting: Cardiology

## 2014-05-18 ENCOUNTER — Other Ambulatory Visit (HOSPITAL_COMMUNITY): Payer: Medicare Other

## 2014-05-18 VITALS — BP 120/84 | HR 72 | Ht 67.5 in | Wt 160.2 lb

## 2014-05-18 DIAGNOSIS — Z01810 Encounter for preprocedural cardiovascular examination: Secondary | ICD-10-CM

## 2014-05-18 DIAGNOSIS — R9431 Abnormal electrocardiogram [ECG] [EKG]: Secondary | ICD-10-CM

## 2014-05-18 NOTE — Progress Notes (Signed)
HPI The patient presents for evaluation of an abnormal EKG. She has no prior cardiac history. She was going to get surgery on her knee for meniscus repair when she was noted to have T-wave inversions in her anterior leads. I don't have an old EKG for comparison. She's had no prior cardiac history. She is somewhat limited because of knee pain but she can still climb stairs. The patient denies any new symptoms such as chest discomfort, neck or arm discomfort. There has been no new shortness of breath, PND or orthopnea. There have been no reported palpitations, presyncope or syncope.  No Known Allergies  Current Outpatient Prescriptions  Medication Sig Dispense Refill  . Calcium Carbonate-Vitamin D (CALCIUM + D PO) Take 1 tablet by mouth daily.      . famotidine (PEPCID) 10 MG tablet Take 10 mg by mouth daily as needed for heartburn or indigestion.      Marland Kitchen losartan (COZAAR) 50 MG tablet Take 50 mg by mouth daily.       . Multiple Vitamin (MULTIVITAMIN WITH MINERALS) TABS tablet Take 1 tablet by mouth daily.      . Vitamin D, Ergocalciferol, (DRISDOL) 50000 UNITS CAPS capsule Take 1 capsule (50,000 Units total) by mouth every 14 (fourteen) days.  6 capsule  4   No current facility-administered medications for this visit.    Past Medical History  Diagnosis Date  . Osteopenia 06/2008  . Pneumonia 2009  . PONV (postoperative nausea and vomiting)   . Hypertension   . Mild dietary indigestion     Past Surgical History  Procedure Laterality Date  . Appendectomy    . Cholecystectomy    . Bladder suspension    . Abdominal hysterectomy      TVH--still has ovaries  . Eye surgery Bilateral     /W IOL    Family History  Problem Relation Age of Onset  . Cancer Father     ?colon ca  . Diabetes Brother   . CAD Mother 41    History   Social History  . Marital Status: Widowed    Spouse Name: N/A    Number of Children: 3  . Years of Education: N/A   Occupational History  . Not on  file.   Social History Main Topics  . Smoking status: Never Smoker   . Smokeless tobacco: Not on file  . Alcohol Use: 1.5 oz/week    3 drink(s) per week     Comment: occas. wine  . Drug Use: No  . Sexual Activity: No     Comment: TVH   Other Topics Concern  . Not on file   Social History Narrative   Lives alone.    ROS:  As stated in the HPI and negative for all other systems.   PHYSICAL EXAM BP 120/84  Pulse 72  Ht 5' 7.5" (1.715 m)  Wt 160 lb 3.2 oz (72.666 kg)  BMI 24.71 kg/m2 GENERAL:  Well appearing HEENT:  Pupils equal round and reactive, fundi not visualized, oral mucosa unremarkable NECK:  No jugular venous distention, waveform within normal limits, carotid upstroke brisk and symmetric, no bruits, no thyromegaly LYMPHATICS:  No cervical, inguinal adenopathy LUNGS:  Clear to auscultation bilaterally BACK:  No CVA tenderness CHEST:  Unremarkable HEART:  PMI not displaced or sustained,S1 and S2 within normal limits, no S3, no S4, no clicks, no rubs, no murmurs ABD:  Flat, positive bowel sounds normal in frequency in pitch, no bruits, no rebound, no  guarding, no midline pulsatile mass, no hepatomegaly, no splenomegaly EXT:  2 plus pulses throughout, no edema, no cyanosis no clubbing SKIN:  No rashes no nodules NEURO:  Cranial nerves II through XII grossly intact, motor grossly intact throughout PSYCH:  Cognitively intact, oriented to person place and time   EKG:  Sinus rhythm, rate 74, axis within normal limits, low-voltage borderline on the chest leads, nonspecific diffuse T wave flattening.  05/18/2014   ASSESSMENT AND PLAN  ABNORMAL EKG:  Because of this screening with a treadmill test. She thinks she would be able to walk this despite her knee. I will Bring her back for aPOET (Plain Old Exercise Treadmill)  HTN:  The blood pressure is at target. No change in medications is indicated. We will continue with therapeutic lifestyle changes (TLC).

## 2014-05-18 NOTE — Patient Instructions (Signed)
Your physician recommends that you schedule a follow-up appointment in: as needed  

## 2014-05-21 ENCOUNTER — Telehealth (HOSPITAL_COMMUNITY): Payer: Self-pay

## 2014-05-21 NOTE — Telephone Encounter (Signed)
Encounter complete. 

## 2014-05-22 ENCOUNTER — Encounter (HOSPITAL_COMMUNITY): Payer: Medicare Other

## 2014-05-26 ENCOUNTER — Encounter (HOSPITAL_COMMUNITY): Payer: Medicare Other

## 2014-05-28 ENCOUNTER — Ambulatory Visit (HOSPITAL_COMMUNITY)
Admission: RE | Admit: 2014-05-28 | Discharge: 2014-05-28 | Disposition: A | Discharge status: Home / Self Care | Payer: Medicare Other | Source: Ambulatory Visit | Attending: Cardiology | Admitting: Cardiology

## 2014-05-28 DIAGNOSIS — R9431 Abnormal electrocardiogram [ECG] [EKG]: Secondary | ICD-10-CM | POA: Diagnosis not present

## 2014-05-28 NOTE — Procedures (Signed)
Exercise Treadmill Test  Pre-Exercise Testing Evaluation   Test  Exercise Tolerance Test Ordering MD: Marijo File, MD    Unique Test No: 1   Treadmill:  1  Indication for ETT: ABN. EKG, surgical clearance  Contraindication to ETT: No   Stress Modality: exercise - treadmill  Cardiac Imaging Performed: non   Protocol: standard Bruce - maximal  Max BP:  189/100  Max MPHR (bpm):  143 85% MPR (bpm):  122  MPHR obtained (bpm):  144 % MPHR obtained:  100  Reached 85% MPHR (min:sec):  5 Total Exercise Time (min-sec):  7:19  Workload in METS:  9.0 Borg Scale: 15  Reason ETT Terminated:  dyspnea    ST Segment Analysis At Rest: non-specific ST segment slurring With Exercise: no evidence of significant ST depression  Other Information Arrhythmia:  No Angina during ETT:  absent (0) Quality of ETT:  diagnostic  ETT Interpretation:  normal - no evidence of ischemia by ST analysis  Comments: The patient had an excellent exercise tolerance.  There was no chest pain.  There was an appropriate level of dyspnea.  There were no arrhythmias, a normal heart rate response and normal BP response.  There were no ischemic ST T wave changes and a normal heart rate recovery.  D[facility] Treadmill score was 8   Recommendations: Negative adequate ETT.  No further testing is indicated.

## 2014-06-04 ENCOUNTER — Telehealth: Payer: Self-pay | Admitting: Cardiology

## 2014-06-04 NOTE — Telephone Encounter (Signed)
Pt called in wanting to know her results from the stress test that was taken last week. Please call  Thanks

## 2014-06-04 NOTE — Telephone Encounter (Signed)
Message routed to [facility]

## 2014-06-05 NOTE — Telephone Encounter (Signed)
i have talked to this pt. And sent copy of the results to Dr. Laurann Montana and Dr. Sharol Given

## 2014-06-24 ENCOUNTER — Other Ambulatory Visit (HOSPITAL_COMMUNITY): Payer: Self-pay | Admitting: Orthopedic Surgery

## 2014-07-13 ENCOUNTER — Inpatient Hospital Stay (HOSPITAL_COMMUNITY): Admission: RE | Admit: 2014-07-13 | Payer: Medicare Other | Source: Ambulatory Visit

## 2014-07-17 ENCOUNTER — Encounter (HOSPITAL_COMMUNITY): Admission: RE | Payer: Self-pay | Source: Ambulatory Visit

## 2014-07-17 ENCOUNTER — Ambulatory Visit (HOSPITAL_COMMUNITY): Admission: RE | Admit: 2014-07-17 | Payer: Medicare Other | Source: Ambulatory Visit | Admitting: Orthopedic Surgery

## 2014-07-17 ENCOUNTER — Telehealth: Payer: Self-pay | Admitting: Obstetrics & Gynecology

## 2014-07-17 SURGERY — ARTHROSCOPY, KNEE
Anesthesia: General | Site: Knee | Laterality: Left

## 2014-07-17 NOTE — Telephone Encounter (Signed)
Pt thinks she may have a yeast infection but not sure.

## 2014-07-17 NOTE — Telephone Encounter (Signed)
Message left to return call to [place] at [phone].    

## 2014-07-17 NOTE — Telephone Encounter (Signed)
Spoke with patient, she is reporting two weeks of external itching of vulvar area. Patient used a Monistat 3 day treatment and finished "several days ago". Patient denies vaginal discharge. Reports external irritation and itching only. Initially was better after treatment, last night itching "returned with a vengeance" and today symptoms are improved but didn't want to go into the weekend without advice.   Patient declines office visit for evaluation. Does not want an office visit for something she describes as "Minor." Patient denies any vaginal bleeding, discharge, fevers, or pelvic pain.  Patient wants to know if there is any harm in repeating 3 day treatment of Monistat. Advised patient that she can complete another course of Monistat if she chooses to. Advised can use Hydrocortisone ointment for external irritation. Advised to keep area clean and dry. Change Damp clothes as soon as possible. White cotton underwear is best. Can also try [place] for relief of external irritation. Patient verbalized understanding of instructions and will return call if symptoms continue for evaluation. She is advised to return call with any worsening of symptoms.   Routing to Dr. Quincy Simmonds as covering provider.  Routing to provider for final review. Patient agreeable to disposition. Will close encounter

## 2014-08-17 ENCOUNTER — Encounter: Payer: Self-pay | Admitting: Cardiology

## 2014-08-24 ENCOUNTER — Telehealth: Payer: Self-pay | Admitting: Obstetrics & Gynecology

## 2014-08-24 NOTE — Telephone Encounter (Signed)
Returning a Call to [place]

## 2014-08-24 NOTE — Telephone Encounter (Signed)
Left message for patient to call back. Need to go over benefits for upcoming PUS. Pr $73.40

## 2014-08-24 NOTE — Telephone Encounter (Signed)
Pt is returning a call to [place]

## 2014-08-25 NOTE — Telephone Encounter (Signed)
Returning Sabrina's call.

## 2014-08-25 NOTE — Telephone Encounter (Signed)
Spoke with patient. Reminded her of PUS appt 12.03.2015 @ 1230. Advised that her out of pocket expectation will be $73.40. Patient agrees.

## 2014-08-25 NOTE — Telephone Encounter (Signed)
Left message for patient to call back. pr $73.40

## 2014-09-17 ENCOUNTER — Ambulatory Visit (INDEPENDENT_AMBULATORY_CARE_PROVIDER_SITE_OTHER): Payer: Medicare Other

## 2014-09-17 ENCOUNTER — Ambulatory Visit (INDEPENDENT_AMBULATORY_CARE_PROVIDER_SITE_OTHER): Payer: Medicare Other | Admitting: Obstetrics & Gynecology

## 2014-09-17 VITALS — BP 116/74 | Ht 66.25 in | Wt 161.0 lb

## 2014-09-17 DIAGNOSIS — N838 Other noninflammatory disorders of ovary, fallopian tube and broad ligament: Secondary | ICD-10-CM

## 2014-09-17 DIAGNOSIS — N83202 Unspecified ovarian cyst, left side: Principal | ICD-10-CM

## 2014-09-17 DIAGNOSIS — N8320 Unspecified ovarian cysts: Secondary | ICD-10-CM

## 2014-09-17 DIAGNOSIS — N83201 Unspecified ovarian cyst, right side: Secondary | ICD-10-CM

## 2014-09-17 NOTE — Progress Notes (Signed)
79 y.o. Widowedfemale here for a pelvic ultrasound.    No LMP recorded. Patient has had a hysterectomy.  Sexually active:  no  Contraception: PMP  FINDINGS: UTERUS: s/p hysterectomy EMS: n/a ADNEXA:  Left ovary 2.5 x 2.0 x 1.0cm with 28 x 65mm thin walled, avascular, echofree cyst.  Was 43m on previous PUS.   Right ovary not seen.  Cyst 48 x 78mmc noted with thin walled, echofree, avascular.  Previously was 47 x 24mm CUL DE SAC: no free fluid  Findings discussed with pt today.  Minimal change of left ovary noted.  Pt does not want to continue watching ovaries and would like to proceed with bilateral salpingo-oophrectomy.  I feel this is reasonable and I have watched this for a year and at every visit, [name] oscillates between doing surgery and following it.  Ultimately she has decided on following it but this has been causing anxiety for her and she just wants to go ahead and "get it done".  Ca-125 was 9.1 one year ago.  Procedure discussed with patient.  Hospital stay, recovery and pain management all discussed.  Risks discussed including but not limited to bleeding, 1% risk of receiving a  transfusion, infection, 1-2% risk of bowel/bladder/ureteral/vascular injury discussed as well as possible need for additional surgery if injury does occur discussed.  DVT/PE and rare risk of death discussed.  My actual complications with prior surgeries discussed.  Hernia formation discussed.  Positioning and incision locations discussed.  Patient aware if pathology abnormal she may need additional treatment.  All questions answered.    Assessment:  Bilateral ovarian cysts in pt with history of abdomimal bloating.  Also hx of TVH  Plan: laparoscopic BSO will be planned.  All questions answered.  Rx for Vicodin given today as well.  ~25 minutes spent with patient >50% of time was in face to face discussion of above.

## 2014-09-18 ENCOUNTER — Telehealth: Payer: Self-pay | Admitting: *Deleted

## 2014-09-18 NOTE — Telephone Encounter (Signed)
Call back to patient. Advised surgery scheduled for 09/28/2014 at 10 AM at [facility]. Surgical instructions given and printed instructions sheet mailed.  Routing to provider for final review. Patient agreeable to disposition. Will close encounter

## 2014-09-18 NOTE — Telephone Encounter (Signed)
Call to patient to discuss surgical date availability. Patient is interested in December possibilities. Only available date in December is 09/28/2014 and patient is agreeable to this date.  We will confirm with hospital and call patient back. Case request submitted to central scheduling.

## 2014-09-21 ENCOUNTER — Telehealth: Payer: Self-pay | Admitting: Obstetrics & Gynecology

## 2014-09-21 NOTE — Telephone Encounter (Signed)
Spoke with patient. Advised that per benefit quote received, she will be responsible to pay $371.42 for the surgeons portion of her surgery. Patient agreeable. Made Visa card payment. Mailed receipt

## 2014-09-22 NOTE — Patient Instructions (Signed)
Your procedure is scheduled on:  Monday, September 28, 2014  Enter through the Main Entrance of [facility] at:  8:30 a.m.  Pick up the phone at the desk and dial [phone].  Call this number if you have problems the morning of surgery: [phone].  Remember: Do NOT eat food: AFTER MIDNIGHT SUNDAY Do NOT drink clear liquids after:   AFTER MIDNIGHT SUNDAY  Take these medicines the morning of surgery with a SIP OF WATER:  LOSARTAN, PEPCID STOP TAKING ALL VITAMINS/HERBAL SUPPLEMENTS  Do NOT wear jewelry (body piercing), metal hair clips/bobby pins, make-up, or nail polish. Do NOT wear lotions, powders, or perfumes.  You may wear deoderant. Do NOT shave for 48 hours prior to surgery. Do NOT bring valuables to the hospital. Contacts, dentures, or bridgework may not be worn into surgery.  Have a responsible adult drive you home and stay with you for 24 hours after your procedure

## 2014-09-23 ENCOUNTER — Encounter (HOSPITAL_COMMUNITY)
Admission: RE | Admit: 2014-09-23 | Discharge: 2014-09-23 | Disposition: A | Discharge status: Home / Self Care | Payer: Medicare Other | Source: Ambulatory Visit | Attending: Obstetrics & Gynecology | Admitting: Obstetrics & Gynecology

## 2014-09-23 ENCOUNTER — Encounter (HOSPITAL_COMMUNITY): Payer: Self-pay

## 2014-09-23 DIAGNOSIS — Z01812 Encounter for preprocedural laboratory examination: Secondary | ICD-10-CM | POA: Diagnosis not present

## 2014-09-23 HISTORY — DX: Gastro-esophageal reflux disease without esophagitis: K21.9

## 2014-09-23 LAB — CBC
HCT: 37.7 % (ref 36.0–46.0)
Hemoglobin: 12.3 g/dL (ref 12.0–15.0)
MCH: 31.5 pg (ref 26.0–34.0)
MCHC: 32.6 g/dL (ref 30.0–36.0)
MCV: 96.7 fL (ref 78.0–100.0)
Platelets: 189 10*3/uL (ref 150–400)
RBC: 3.9 MIL/uL (ref 3.87–5.11)
RDW: 12.9 % (ref 11.5–15.5)
WBC: 6.9 10*3/uL (ref 4.0–10.5)

## 2014-09-23 LAB — BASIC METABOLIC PANEL
ANION GAP: 12 (ref 5–15)
BUN: 22 mg/dL (ref 6–23)
CALCIUM: 9.2 mg/dL (ref 8.4–10.5)
CO2: 28 meq/L (ref 19–32)
CREATININE: 0.72 mg/dL (ref 0.50–1.10)
Chloride: 99 mEq/L (ref 96–112)
GFR calc non Af Amer: 81 mL/min — ABNORMAL LOW (ref >90)
Glucose, Bld: 96 mg/dL (ref 70–99)
Potassium: 4.1 mEq/L (ref 3.7–5.3)
SODIUM: 139 meq/L (ref 137–147)

## 2014-09-28 ENCOUNTER — Encounter (HOSPITAL_COMMUNITY): Payer: Self-pay | Admitting: *Deleted

## 2014-09-28 ENCOUNTER — Encounter: Payer: Self-pay | Admitting: Obstetrics & Gynecology

## 2014-09-28 ENCOUNTER — Ambulatory Visit (HOSPITAL_COMMUNITY): Payer: Medicare Other | Admitting: Anesthesiology

## 2014-09-28 ENCOUNTER — Ambulatory Visit (HOSPITAL_COMMUNITY)
Admission: RE | Admit: 2014-09-28 | Discharge: 2014-09-28 | Disposition: A | Discharge status: Home / Self Care | Payer: Medicare Other | Source: Ambulatory Visit | Attending: Obstetrics & Gynecology | Admitting: Obstetrics & Gynecology

## 2014-09-28 ENCOUNTER — Encounter (HOSPITAL_COMMUNITY)
Admission: RE | Disposition: A | Discharge status: Home / Self Care | Payer: Self-pay | Source: Ambulatory Visit | Attending: Obstetrics & Gynecology

## 2014-09-28 DIAGNOSIS — I10 Essential (primary) hypertension: Secondary | ICD-10-CM | POA: Diagnosis not present

## 2014-09-28 DIAGNOSIS — N8320 Unspecified ovarian cysts: Secondary | ICD-10-CM | POA: Diagnosis present

## 2014-09-28 DIAGNOSIS — K219 Gastro-esophageal reflux disease without esophagitis: Secondary | ICD-10-CM | POA: Diagnosis not present

## 2014-09-28 DIAGNOSIS — N8329 Other ovarian cysts: Secondary | ICD-10-CM | POA: Insufficient documentation

## 2014-09-28 HISTORY — PX: LAPAROSCOPIC BILATERAL SALPINGO OOPHERECTOMY: SHX5890

## 2014-09-28 HISTORY — PX: CYSTOSCOPY: SHX5120

## 2014-09-28 SURGERY — SALPINGO-OOPHORECTOMY, BILATERAL, LAPAROSCOPIC
Anesthesia: General | Site: Bladder

## 2014-09-28 MED ORDER — PROMETHAZINE HCL 25 MG/ML IJ SOLN: 6.2500 mg | INTRAMUSCULAR | Status: DC | PRN

## 2014-09-28 MED ORDER — DEXTROSE 5 % IV SOLN
2.0000 g | INTRAVENOUS | Status: AC
  Administered 2014-09-28: 2 g via INTRAVENOUS
  Filled 2014-09-28: qty 2

## 2014-09-28 MED ORDER — PROPOFOL 10 MG/ML IV BOLUS
INTRAVENOUS | Status: DC | PRN
  Administered 2014-09-28: 20 mg via INTRAVENOUS
  Administered 2014-09-28: 150 mg via INTRAVENOUS

## 2014-09-28 MED ORDER — LIDOCAINE HCL (CARDIAC) 20 MG/ML IV SOLN
INTRAVENOUS | Status: AC
  Filled 2014-09-28: qty 5

## 2014-09-28 MED ORDER — ONDANSETRON HCL 4 MG/2ML IJ SOLN
INTRAMUSCULAR | Status: AC
  Filled 2014-09-28: qty 2

## 2014-09-28 MED ORDER — KETOROLAC TROMETHAMINE 30 MG/ML IJ SOLN
INTRAMUSCULAR | Status: AC
  Filled 2014-09-28: qty 1

## 2014-09-28 MED ORDER — BUPIVACAINE HCL (PF) 0.25 % IJ SOLN
INTRAMUSCULAR | Status: DC | PRN
  Administered 2014-09-28: 9 mL

## 2014-09-28 MED ORDER — MEPERIDINE HCL 25 MG/ML IJ SOLN
6.2500 mg | INTRAMUSCULAR | Status: DC | PRN
Start: 2014-09-28 — End: 2014-09-28

## 2014-09-28 MED ORDER — FENTANYL CITRATE 0.05 MG/ML IJ SOLN
INTRAMUSCULAR | Status: AC
  Filled 2014-09-28: qty 2

## 2014-09-28 MED ORDER — SODIUM CHLORIDE 0.9 % IJ SOLN
INTRAMUSCULAR | Status: DC | PRN
  Administered 2014-09-28: 3 mL

## 2014-09-28 MED ORDER — KETOROLAC TROMETHAMINE 30 MG/ML IJ SOLN
INTRAMUSCULAR | Status: DC | PRN
  Administered 2014-09-28: 15 mg via INTRAVENOUS

## 2014-09-28 MED ORDER — STERILE WATER FOR IRRIGATION IR SOLN
Status: DC | PRN
  Administered 2014-09-28: 1000 mL via INTRAVESICAL

## 2014-09-28 MED ORDER — EPHEDRINE 5 MG/ML INJ
INTRAVENOUS | Status: AC
  Filled 2014-09-28: qty 10

## 2014-09-28 MED ORDER — NEOSTIGMINE METHYLSULFATE 10 MG/10ML IV SOLN
INTRAVENOUS | Status: DC | PRN
  Administered 2014-09-28: 4 mg via INTRAVENOUS

## 2014-09-28 MED ORDER — DIPHENHYDRAMINE HCL 50 MG/ML IJ SOLN
INTRAMUSCULAR | Status: AC
  Filled 2014-09-28: qty 1

## 2014-09-28 MED ORDER — SCOPOLAMINE 1 MG/3DAYS TD PT72: 1.0000 | MEDICATED_PATCH | Freq: Once | TRANSDERMAL | Status: DC

## 2014-09-28 MED ORDER — NEOSTIGMINE METHYLSULFATE 10 MG/10ML IV SOLN
INTRAVENOUS | Status: AC
  Filled 2014-09-28: qty 1

## 2014-09-28 MED ORDER — DEXAMETHASONE SODIUM PHOSPHATE 10 MG/ML IJ SOLN
INTRAMUSCULAR | Status: DC | PRN
  Administered 2014-09-28: 4 mg via INTRAVENOUS

## 2014-09-28 MED ORDER — ONDANSETRON HCL 4 MG/2ML IJ SOLN
INTRAMUSCULAR | Status: DC | PRN
  Administered 2014-09-28: 4 mg via INTRAVENOUS

## 2014-09-28 MED ORDER — DEXAMETHASONE SODIUM PHOSPHATE 4 MG/ML IJ SOLN
INTRAMUSCULAR | Status: AC
  Filled 2014-09-28: qty 1

## 2014-09-28 MED ORDER — LACTATED RINGERS IV SOLN
INTRAVENOUS | Status: DC
  Administered 2014-09-28 (×2): via INTRAVENOUS

## 2014-09-28 MED ORDER — LACTATED RINGERS IR SOLN
Status: DC | PRN
  Administered 2014-09-28: 3000 mL

## 2014-09-28 MED ORDER — ROCURONIUM BROMIDE 100 MG/10ML IV SOLN
INTRAVENOUS | Status: DC | PRN
  Administered 2014-09-28: 30 mg via INTRAVENOUS
  Administered 2014-09-28: 10 mg via INTRAVENOUS

## 2014-09-28 MED ORDER — LIDOCAINE HCL (CARDIAC) 20 MG/ML IV SOLN
INTRAVENOUS | Status: DC | PRN
  Administered 2014-09-28: 10 mg via INTRAVENOUS

## 2014-09-28 MED ORDER — GLYCOPYRROLATE 0.2 MG/ML IJ SOLN
INTRAMUSCULAR | Status: DC | PRN
  Administered 2014-09-28: 0.6 mg via INTRAVENOUS

## 2014-09-28 MED ORDER — FENTANYL CITRATE 0.05 MG/ML IJ SOLN
INTRAMUSCULAR | Status: DC | PRN
  Administered 2014-09-28 (×2): 25 ug via INTRAVENOUS
  Administered 2014-09-28 (×2): 50 ug via INTRAVENOUS

## 2014-09-28 MED ORDER — GLYCOPYRROLATE 0.2 MG/ML IJ SOLN
INTRAMUSCULAR | Status: AC
  Filled 2014-09-28: qty 3

## 2014-09-28 MED ORDER — FENTANYL CITRATE 0.05 MG/ML IJ SOLN: 25.0000 ug | INTRAMUSCULAR | Status: DC | PRN

## 2014-09-28 MED ORDER — EPHEDRINE SULFATE 50 MG/ML IJ SOLN
INTRAMUSCULAR | Status: DC | PRN
  Administered 2014-09-28 (×2): 5 mg via INTRAVENOUS
  Administered 2014-09-28 (×2): 10 mg via INTRAVENOUS

## 2014-09-28 MED ORDER — STERILE WATER FOR IRRIGATION IR SOLN
Status: DC | PRN
  Administered 2014-09-28: 1000 mL

## 2014-09-28 MED ORDER — ROCURONIUM BROMIDE 100 MG/10ML IV SOLN
INTRAVENOUS | Status: AC
  Filled 2014-09-28: qty 1

## 2014-09-28 MED ORDER — PROPOFOL 10 MG/ML IV EMUL
INTRAVENOUS | Status: AC
  Filled 2014-09-28: qty 20

## 2014-09-28 MED ORDER — HEPARIN SODIUM (PORCINE) 5000 UNIT/ML IJ SOLN
INTRAMUSCULAR | Status: AC
  Filled 2014-09-28: qty 1

## 2014-09-28 MED ORDER — MIDAZOLAM HCL 2 MG/2ML IJ SOLN
INTRAMUSCULAR | Status: AC
  Filled 2014-09-28: qty 2

## 2014-09-28 SURGICAL SUPPLY — 40 items
APL SKNCLS STERI-STRIP NONHPOA (GAUZE/BANDAGES/DRESSINGS)
BAG SPEC RTRVL LRG 6X4 10 (ENDOMECHANICALS)
BENZOIN TINCTURE PRP APPL 2/3 (GAUZE/BANDAGES/DRESSINGS) IMPLANT
CABLE HIGH FREQUENCY MONO STRZ (ELECTRODE) ×5 IMPLANT
CATH ROBINSON RED A/P 16FR (CATHETERS) IMPLANT
CHLORAPREP W/TINT 26ML (MISCELLANEOUS) ×5 IMPLANT
CLOSURE WOUND 1/2 X4 (GAUZE/BANDAGES/DRESSINGS)
CLOSURE WOUND 1/4X4 (GAUZE/BANDAGES/DRESSINGS) ×1
CLOTH BEACON ORANGE TIMEOUT ST (SAFETY) ×5 IMPLANT
DRSG COVADERM PLUS 2X2 (GAUZE/BANDAGES/DRESSINGS) ×10 IMPLANT
DRSG OPSITE POSTOP 3X4 (GAUZE/BANDAGES/DRESSINGS) ×3 IMPLANT
FILTER SMOKE EVAC LAPAROSHD (FILTER) ×3 IMPLANT
FORCEPS CUTTING 33CM 5MM (CUTTING FORCEPS) ×3 IMPLANT
GLOVE BIOGEL PI IND STRL 7.0 (GLOVE) ×6 IMPLANT
GLOVE BIOGEL PI INDICATOR 7.0 (GLOVE) ×4
GLOVE ECLIPSE 6.5 STRL STRAW (GLOVE) ×10 IMPLANT
GOWN STRL REUS W/TWL LRG LVL3 (GOWN DISPOSABLE) ×5 IMPLANT
LIQUID BAND (GAUZE/BANDAGES/DRESSINGS) ×3 IMPLANT
NEEDLE INSUFFLATION 120MM (ENDOMECHANICALS) ×5 IMPLANT
PACK LAPAROSCOPY BASIN (CUSTOM PROCEDURE TRAY) ×5 IMPLANT
PAD TRENDELENBURG OR TABLE (MISCELLANEOUS) ×5 IMPLANT
POUCH SPECIMEN RETRIEVAL 10MM (ENDOMECHANICALS) IMPLANT
PROTECTOR NERVE ULNAR (MISCELLANEOUS) ×5 IMPLANT
SCISSORS LAP 5X35 DISP (ENDOMECHANICALS) ×3 IMPLANT
SEALER TISSUE G2 CVD JAW 35 (ENDOMECHANICALS) IMPLANT
SEALER TISSUE G2 CVD JAW 45CM (ENDOMECHANICALS)
SET CYSTO W/LG BORE CLAMP LF (SET/KITS/TRAYS/PACK) ×3 IMPLANT
SET IRRIG TUBING LAPAROSCOPIC (IRRIGATION / IRRIGATOR) IMPLANT
STRIP CLOSURE SKIN 1/2X4 (GAUZE/BANDAGES/DRESSINGS) IMPLANT
STRIP CLOSURE SKIN 1/4X4 (GAUZE/BANDAGES/DRESSINGS) ×4 IMPLANT
SUT VICRYL 0 UR6 27IN ABS (SUTURE) IMPLANT
SUT VICRYL 4-0 PS2 18IN ABS (SUTURE) ×5 IMPLANT
SYR 30ML LL (SYRINGE) IMPLANT
TOWEL OR 17X24 6PK STRL BLUE (TOWEL DISPOSABLE) ×10 IMPLANT
TRAY FOLEY CATH 14FR (SET/KITS/TRAYS/PACK) ×5 IMPLANT
TROCAR BALLN 12MMX100 BLUNT (TROCAR) IMPLANT
TROCAR XCEL NON-BLD 11X100MML (ENDOMECHANICALS) IMPLANT
TROCAR XCEL NON-BLD 5MMX100MML (ENDOMECHANICALS) IMPLANT
WARMER LAPAROSCOPE (MISCELLANEOUS) ×5 IMPLANT
WATER STERILE IRR 1000ML POUR (IV SOLUTION) ×5 IMPLANT

## 2014-09-28 NOTE — Discharge Instructions (Signed)
Post-surgical Instructions, Outpatient Surgery  You may expect to feel dizzy, weak, and drowsy for as long as 24 hours after receiving the medicine that made you sleep (anesthetic). For the first 24 hours after your surgery:    Do not drive a car, ride a bicycle, participate in physical activities, or take public transportation until you are done taking narcotic pain medicines or as directed by Dr. Sabra Heck.   Do not drink alcohol or take tranquilizers.   Do not take medicine that has not been prescribed by your physicians.   Do not sign important papers or make important decisions while on narcotic pain medicines.   Have a responsible person with you.   CARE OF INCISION  Remove the two small bandages in 24 hours.  Leave the large one on for three days.  You may shower on the first day after your surgery.  It is ok to get the larger bandage wet.  Do not sit in a tub bath for one week.  Avoid heavy lifting (more than 15 pounds), pushing, or pulling for at least a week.  Avoid activities that may risk injury to your incisions.   PAIN MANAGEMENT  Motrin 800mg .  (This is the same as 4-200mg  over the counter tablets of Motrin or ibuprofen.)  You may take this every eight hours or as needed for cramping.  You may also take Tylenol if this works for you.  Vicodin 5/500mg .  For more severe pain, take one or two tablets every four to six hours as needed for pain control.  (Remember that narcotic pain medications increase your risk of constipation.  If this becomes a problem, you may take an over the counter stool softener like Colace 100mg  up to four times a day.)  DO'S AND DON'T'S  Do not take a tub bath for one week.  You may shower on the first day after your surgery  Do not do any heavy lifting for one to two weeks.  This increases the chance of bleeding.  Do move around as you feel able.  Stairs are fine.  You may begin to exercise again as you feel able.  Do not lift any weights for  two weeks.  Do not put anything in the vagina for two weeks--no tampons, intercourse, or douching.    REGULAR MEDIATIONS/VITAMINS:  You may restart all of your regular medications as prescribed.  You may restart all of your vitamins as you normally take them.    PLEASE CALL OR SEEK MEDICAL CARE IF:  You have persistent nausea and vomiting.   You have trouble eating or drinking.   You have an oral temperature above 100.5.   You have constipation that is not helped by adjusting diet or increasing fluid intake. Pain medicines are a common cause of constipation.   You have heavy vaginal bleeding  You have redness or drainage from your incision(s) or there is increasing pain or tenderness near or in the surgical site.    DISCHARGE INSTRUCTIONS: Laparoscopy  MAY TAKE IBUPROFEN (MOTRIN, ADVIL) OR ALEVE AFTER 5:30 PM FOR CRAMPS!!!   The following instructions have been prepared to help you care for yourself upon your return home today.  Wound care:  Do not get the incision wet for the first 24 hours. The incision should be kept clean and dry.  The Band-Aids or dressings may be removed the day after surgery.  Should the incision become sore, red, and swollen after the first week, check with your doctor.  Personal hygiene:  Shower the day after your procedure.  Activity and limitations:  Do NOT drive or operate any equipment today.  Do NOT lift anything more than 15 pounds for 2-3 weeks after surgery.  Do NOT rest in bed all day.  Walking is encouraged. Walk each day, starting slowly with 5-minute walks 3 or 4 times a day. Slowly increase the length of your walks.  Walk up and down stairs slowly.  Do NOT do strenuous activities, such as golfing, playing tennis, bowling, running, biking, weight lifting, gardening, mowing, or vacuuming for 2-4 weeks. Ask your doctor when it is okay to start.  Diet: Eat a light meal as desired this evening. You may resume your usual diet  tomorrow.  Return to work: This is dependent on the type of work you do. For the most part you can return to a desk job within a week of surgery. If you are more active at work, please discuss this with your doctor.  What to expect after your surgery: You may have a slight burning sensation when you urinate on the first day. You may have a very small amount of blood in the urine. Expect to have a small amount of vaginal discharge/light bleeding for 1-2 weeks. It is not unusual to have abdominal soreness and bruising for up to 2 weeks. You may be tired and need more rest for about 1 week. You may experience shoulder pain for 24-72 hours. Lying flat in bed may relieve it.  Call your doctor for any of the following:  Develop a fever of 100.4 or greater  Inability to urinate 6 hours after discharge from hospital  Severe pain not relieved by pain medications  Persistent of heavy bleeding at incision site  Redness or swelling around incision site after a week  Increasing nausea or vomiting  Patient Signature________________________________________ Nurse Signature_________________________________________

## 2014-09-28 NOTE — Anesthesia Postprocedure Evaluation (Signed)
  Anesthesia Post Note  Patient: Samanta Gal Regula  Procedure(s) Performed: Procedure(s) (LRB): LAPAROSCOPIC BILATERAL SALPINGO OOPHORECTOMY; lysis of adhesions (Bilateral) CYSTOSCOPY  (N/A)  Anesthesia type: GA  Patient location: PACU  Post pain: Pain level controlled  Post assessment: Post-op Vital signs reviewed  Last Vitals:  Filed Vitals:   09/28/14 1204  BP: 125/78  Pulse: 78  Temp: 36.4 C  Resp: 14    Post vital signs: Reviewed  Level of consciousness: sedated  Complications: No apparent anesthesia complications

## 2014-09-28 NOTE — Op Note (Signed)
09/28/2014  11:53 AM  PATIENT:  [name]  79 y.o. female  PRE-OPERATIVE DIAGNOSIS: persistent bilateral ovarian cysts  POST-OPERATIVE DIAGNOSIS:  same  PROCEDURE:  Procedure(s): LAPAROSCOPIC BILATERAL SALPINGO OOPHORECTOMY; lysis of adhesions CYSTOSCOPY   SURGEON:  Tameah Mihalko SUZANNE  ASSISTANTS: Josefa Half   ANESTHESIA:   general  ESTIMATED BLOOD LOSS:  20cc  BLOOD ADMINISTERED:none   FLUIDS: 1000cc LR  UOP: 300cc clear  SPECIMEN:  Bilateral fallopian tubes and ovaries, pelvic washings  DISPOSITION OF SPECIMEN:  PATHOLOGY  FINDINGS: enlarged bilateral ovaries due to smooth cysts.  Right ovary about 4cm total.  Adhesions of bilateral ovaries to vaginal cuff.  Pt with hx of prior TVH.  Normal upper abdomen.  No other adhesions.  DESCRIPTION OF OPERATION: Patient is taken to the operating room. She is placed in the supine position. She is a running IV in place. Informed consent was present on the chart. SCDs on her lower extremities and functioning properly. General endotracheal anesthesia was administered by the anesthesia staff without difficulty. Once adequate anesthesia was confirmed the legs are placed in the low lithotomy position in [place]. The patient was already on a blue pad. Her arms were tucked by the side.    Chlor prep was then used to prep the abdomen and Betadine was used to prep the inner thighs, perineum and vagina. Once 3 minutes had past the patient was draped in a normal standard fashion. The legs were lifted to the high lithotomy position. The cervix and uterus were absent due to prior [facility].  Sponge stick was placed in the vaginal as well as a foley to straight drain.   Legs were lowered to the low lithotomy position and attention was turned the abdomen.   Attention was turned to the abdomen. The umbilicus was anesthetized with .025% Marcaine. The skin was incised with a #11 blade. The abdomen was elevated and Veress needle was passed  directly into the abdomen. The peritoneum was felt as a pop as it was passed with the needle. A syringe of normal saline was attached the needle and aspiration was performed. No blood or fluid was noted. Fluid was injected without difficulty and a second aspiration was performed. No fluid or blood or saline was noted. Fluid dripped easily into the needle. Low flow CO2 gas was attached the needle and the pneumoperitoneum was achieved without difficulty. Once for 3.5L of gas was in the abdomen the Veress needle was removed and a 10 millimeter port non-bladed trocar/port (Optiview) were passed directly to the abdomen. The laparoscope was then used to confirm intraperitoneal placement. Port site locations for the RLQ and LLQ ports were chosen. The skin was transilluminated and the skin was anesthetized with ropivacaine mixture. 50mm skin incisions were made and 42mm nondisposable trocar ports were passed directly into the abdomen.  Upper abdomen and pelvis was surveyed. No abnormal findings were noted except for the enlarged ovaries and adhesions to vaginal cuff. Pt was placed in Trendelenburg position. Ureters were identified.   Pelvic washings were obtained.  Attention was turned to the right side. The right IP ligament was serially clamped, cauterized, and incised with the Gyrus device.  Then the round ligament, which was scarred to the ovary, was serially clamped, cauterized, and incised.  Then the incision was carried down across thin scar tissue to the top of the vaginal cuff.  At this point the scar was thicker.  Keeping very close to the ovary, the ovary was incised off the  vaginal cuff with sharp dissection.  Care was taken to note the location of the bladder.  Specimen was left in the pelvis and attention was turned to the left side.  The left IP ligament was serially clamped cauterized and incised. Next the left round ligament was serially clamped cauterized and incised. In a similar fashion to the right  side, there was filmy scarring that was incised sharply.  The ovary was scarred further down on the vaginal cuff on this side so the bladder was inflated with sterile water to delineate the bladder edge completely.  The ovary was clearly along the vaginal cuff which was at least a centimeter or greater away from the bladder edge.  Keeping very close to the ovary, the scar was incised with sharp dissection.  With inspection of the right side, there appeared to be a small piece of ovary that was left and this was elevated and excised, keeping very close to the ovary but clearly still along the vaginal cuff.    Then using an endocatch bag, the ovaries were delivered through the midline incision.  The ovaries both needed to be opened in the bag to decompress the ovarian cysts and deliver the entire specimen through the incision.  There was no spill in the abdomen of cyst contents.  Cyst fluid was clear.  At this point, both ovaries were delivered through the incision.  Next the pneumoperitoneum was achieved again.  The pedicles were inspected and there was a small amount of bleeding along the cuff edge.  This was made hemostatic with bipolar cautery.  Irrigation was performed again and all pedicles were hemostatic.    The patient was taken out of Trendelenburg positioning and the inferior ports removed.  The pneumoperitoneum was relieved.  Foley was removed.  A cystoscopy was performed at this point to ensure no bladder injury.  Bladder mucosa was completely intact without any evidence of cautery injury or any defects in the mucosa.  Urine was seen from each ureteral orifice.  Fluid was drained and foley was not replaced.    At this point, several deep breaths were given to try and remove any gas from the abdomen.  The midline port was removed.  The fascia was closed with a figure of eight suture of #0 Vicryl.  The skin was closed with #3.0 Vicryl and the skin was closed with subcuticular stitches before dermabond  was applied.    Sponge stick was removed from the vagina and the vagina inspected.  No bleeding was noted.   At this point, procedure was ended.  Sponge, lap, needle, and instrument counts were correct x2. Patient tolerated the procedure very well, was awakened from anesthesia and taken to the recovery room in stable condition.  COUNTS:  YES  PLAN OF CARE: Transfer to PACU

## 2014-09-28 NOTE — H&P (Signed)
[name] is an 79 y.o. female G71P3 WWF here for laparoscopic BSO due to persistent ovarian cysts.  These have really not changed and are avascular with smooth walls.  Ca-125 was 9 when first noted on ultrasound.  Pt has continued to worry about her ovaries and decided to proceed with surgical removal of both.  Risks and benefits have been discussed with pt and she is here and ready to proceed.  She has no family with her today.    Pertinent Gynecological History: Menses: post-menopausal Bleeding: none Contraception: hysterectomy DES exposure: denies Blood transfusions: none Sexually transmitted diseases: none Previous GYN Procedures: TVH  Last mammogram: normal Date: 08/10/14 Last pap: n/a Date: h/o TVH OB History: G3, P3   Menstrual History: No LMP recorded. Patient has had a hysterectomy.    Past Medical History  Diagnosis Date  . Osteopenia 06/2008  . Pneumonia 2009  . PONV (postoperative nausea and vomiting)   . Hypertension   . Mild dietary indigestion   . GERD (gastroesophageal reflux disease)     Past Surgical History  Procedure Laterality Date  . Appendectomy    . Cholecystectomy    . Bladder suspension    . Abdominal hysterectomy      TVH--still has ovaries  . Eye surgery Bilateral     /W IOL  . Colonoscopy    . Wisdom tooth extraction      Family History  Problem Relation Age of Onset  . Cancer Father     ?colon ca  . Diabetes Brother   . CAD Mother 80    Social History:  reports that she has never smoked. She has never used smokeless tobacco. She reports that she drinks about 1.5 oz of alcohol per week. She reports that she does not use illicit drugs.  Allergies: No Known Allergies  Prescriptions prior to admission  Medication Sig Dispense Refill Last Dose  . Calcium Carbonate-Vitamin D (CALCIUM + D PO) Take 1 tablet by mouth daily.   Past Week at Unknown time  . famotidine (PEPCID) 20 MG tablet Take 20 mg by mouth daily as needed for heartburn  or indigestion.   09/28/2014 at Unknown time  . losartan-hydrochlorothiazide (HYZAAR) 50-12.5 MG per tablet Take 1 tablet by mouth daily.   09/28/2014 at Unknown time  . Multiple Vitamin (MULTIVITAMIN WITH MINERALS) TABS tablet Take 1 tablet by mouth daily.   Past Week at Unknown time  . Vitamin D, Ergocalciferol, (DRISDOL) 50000 UNITS CAPS capsule Take 1 capsule (50,000 Units total) by mouth every 14 (fourteen) days. 6 capsule 4 Past Week at Unknown time  . EUFLEXXA 20 MG/2ML SOSY      . HYDROcodone-acetaminophen (NORCO/VICODIN) 5-325 MG per tablet        Review of Systems  All other systems reviewed and are negative.   Blood pressure 102/84, pulse 70, temperature 97.7 F (36.5 C), temperature source Oral, SpO2 98 %. Physical Exam  Constitutional: She appears well-developed and well-nourished.  Neck: No thyromegaly present.  Cardiovascular: Normal rate and regular rhythm.   Respiratory: Effort normal and breath sounds normal.  GI: Soft. Bowel sounds are normal.  Skin: Skin is warm and dry.  Psychiatric: She has a normal mood and affect.    No results found for this or any previous visit (from the past 24 hour(s)).  No results found.  Assessment/Plan: 79 yo G3P3 [name] here for laparoscopic BSO due to persistent ovarian cysts and patient anxiety.  Risks and benefits reviewed.  All  questions answered.  Pt here and ready to proceed.  Hale Bogus SUZANNE 09/28/2014, 9:41 AM

## 2014-09-28 NOTE — Transfer of Care (Signed)
Immediate Anesthesia Transfer of Care Note  Patient: [name]  Procedure(s) Performed: Procedure(s): LAPAROSCOPIC BILATERAL SALPINGO OOPHORECTOMY; lysis of adhesions (Bilateral) CYSTOSCOPY  (N/A)  Patient Location: PACU  Anesthesia Type:General  Level of Consciousness: awake, oriented and patient cooperative  Airway & Oxygen Therapy: Patient Spontanous Breathing and Patient connected to nasal cannula oxygen  Post-op Assessment: Report given to PACU RN and Post -op Vital signs reviewed and stable  Post vital signs: Reviewed and stable  Complications: No apparent anesthesia complications

## 2014-09-28 NOTE — Anesthesia Procedure Notes (Signed)
Procedure Name: Intubation Date/Time: 09/28/2014 10:27 AM Performed by: Tobin Chad Pre-anesthesia Checklist: Patient identified, Emergency Drugs available, Suction available, Patient being monitored and Timeout performed Patient Re-evaluated:Patient Re-evaluated prior to inductionOxygen Delivery Method: Circle system utilized Preoxygenation: Pre-oxygenation with 100% oxygen Intubation Type: IV induction and Cricoid Pressure applied Ventilation: Mask ventilation without difficulty Laryngoscope Size: Miller and 3 Grade View: Grade III Tube type: Oral Number of attempts: 2 Placement Confirmation: ETT inserted through vocal cords under direct vision Secured at: 22 cm Tube secured with: Tape

## 2014-09-28 NOTE — Anesthesia Preprocedure Evaluation (Signed)
Anesthesia Evaluation  Patient identified by MRN, date of birth, ID band Patient awake    Reviewed: Allergy & Precautions, H&P , Patient's Chart, lab work & pertinent test results, reviewed documented beta blocker date and time   History of Anesthesia Complications (+) PONV and history of anesthetic complications  Airway Mallampati: II  TM Distance: >3 FB Neck ROM: full    Dental   Pulmonary pneumonia -, resolved,  breath sounds clear to auscultation        Cardiovascular Exercise Tolerance: Good hypertension, Rhythm:regular Rate:Normal     Neuro/Psych    GI/Hepatic GERD-  Controlled,  Endo/Other    Renal/GU      Musculoskeletal   Abdominal   Peds  Hematology   Anesthesia Other Findings   Reproductive/Obstetrics                             Anesthesia Physical Anesthesia Plan  ASA: II  Anesthesia Plan: General ETT   Post-op Pain Management:    Induction:   Airway Management Planned:   Additional Equipment:   Intra-op Plan:   Post-operative Plan:   Informed Consent: I have reviewed the patients History and Physical, chart, labs and discussed the procedure including the risks, benefits and alternatives for the proposed anesthesia with the patient or authorized representative who has indicated his/her understanding and acceptance.   Dental Advisory Given  Plan Discussed with: CRNA and Surgeon  Anesthesia Plan Comments:         Anesthesia Quick Evaluation

## 2014-09-29 ENCOUNTER — Encounter (HOSPITAL_COMMUNITY): Payer: Self-pay | Admitting: Obstetrics & Gynecology

## 2014-10-14 ENCOUNTER — Encounter: Payer: Self-pay | Admitting: Obstetrics & Gynecology

## 2014-10-14 ENCOUNTER — Ambulatory Visit (INDEPENDENT_AMBULATORY_CARE_PROVIDER_SITE_OTHER): Payer: Medicare Other | Admitting: Obstetrics & Gynecology

## 2014-10-14 VITALS — BP 122/72 | HR 60 | Resp 16 | Wt 162.0 lb

## 2014-10-14 DIAGNOSIS — Z9889 Other specified postprocedural states: Secondary | ICD-10-CM

## 2014-10-14 NOTE — Progress Notes (Signed)
Post Operative Visit  Procedure:Laparoscopic Bilateral Salpingo-oophorectomy Days Post-op: 17  Subjective: Doing well.  Didn't take any pain medication after surgery.  Having no change in bowel movements.  Still has, at times, softer and pudding like consistency with BMs.  Emptying bladder fine.  No fever.  No vaginal bleeding.    Objective: There were no vitals taken for this visit.  EXAM General: alert and cooperative Resp: clear to auscultation bilaterally Cardio: regular rate and rhythm, S1, S2 normal, no murmur, click, rub or gallop GI: soft, non-tender; bowel sounds normal; no masses,  no organomegaly and incision: clean, dry and intact Extremities: extremities normal, atraumatic, no cyanosis or edema Vaginal Bleeding: none  Assessment: s/p laparoscopic BSO, doing well  Plan: AEX 1 year

## 2015-01-27 ENCOUNTER — Other Ambulatory Visit: Payer: Self-pay | Admitting: Obstetrics & Gynecology

## 2015-01-27 NOTE — Telephone Encounter (Signed)
Medication refill request: Vitamin D 50,000 Last AEX:  09/05/13 SM Next AEX: 12/10/15 SM Last MMG (if hormonal medication request): 08/10/14 [id number]:Neg Refill authorized: 09/05/13 #6caps/4R. Today?

## 2015-01-28 ENCOUNTER — Other Ambulatory Visit: Payer: Self-pay | Admitting: Dermatology

## 2015-05-05 ENCOUNTER — Other Ambulatory Visit: Payer: Self-pay | Admitting: Obstetrics & Gynecology

## 2015-05-05 NOTE — Telephone Encounter (Signed)
Medication refill request: Vitamin D 50,000 iu's  Last AEX:  09/05/2013 Next AEX: 12/10/15 with SM Last MMG (if hormonal medication request): n/a Refill authorized: #6/6 rfs, please advise.  Routed to Dr. Quincy Simmonds since Dr. Sabra Heck is out of office today.

## 2015-05-05 NOTE — Telephone Encounter (Signed)
Left Message To Call Back  

## 2015-05-05 NOTE — Telephone Encounter (Signed)
Pt states she has 1 pill left of her vitamin D prescription from Dr Sabra Heck. Pt asking if its possible to refill for more than a one month supply? [facility]

## 2015-05-05 NOTE — Telephone Encounter (Signed)
Please have the patient come in for a lab visit to check her Vitamin D level. I do not see this in our records.  After the level comes back, we will have a better idea about continuation on prescription vit D Rx, dosage and frequency if needed.  Thank you!

## 2015-05-06 ENCOUNTER — Other Ambulatory Visit (INDEPENDENT_AMBULATORY_CARE_PROVIDER_SITE_OTHER): Payer: Medicare Other

## 2015-05-06 DIAGNOSIS — E559 Vitamin D deficiency, unspecified: Secondary | ICD-10-CM

## 2015-05-06 NOTE — Telephone Encounter (Signed)
Thank you :)

## 2015-05-06 NOTE — Telephone Encounter (Signed)
Patient called back and I scheduled her for Vitamin D level recheck for today 05/06/15 at 11:00.

## 2015-05-07 LAB — VITAMIN D 25 HYDROXY (VIT D DEFICIENCY, FRACTURES): Vit D, 25-Hydroxy: 37 ng/mL (ref 30–100)

## 2015-05-13 ENCOUNTER — Telehealth: Payer: Self-pay | Admitting: Emergency Medicine

## 2015-05-13 NOTE — Telephone Encounter (Signed)
Called patient with message from Dr. Quincy Simmonds. Patient agreeable to instructions. She will follow up as scheduled. Routing to provider for final review. Patient agreeable to disposition. Will close encounter.   Patient aware provider will review message and nurse will return call if any additional advice or change of disposition.

## 2015-05-13 NOTE — Telephone Encounter (Signed)
-----   Message from Nunzio Cobbs, MD sent at 05/07/2015  5:11 PM EDT ----- Vit D is now into a normal range.  (I do not see a prior level in the computer.) Goal is 30 - 50 ng/ml. I would recommend taking over the counter vit D 600 - 800 IU daily.  Please have her see Dr. Sabra Heck for her annual exam as scheduled.  If she has any concerns, please call back.

## 2015-06-10 ENCOUNTER — Other Ambulatory Visit: Payer: Self-pay | Admitting: Gastroenterology

## 2015-08-17 ENCOUNTER — Encounter (HOSPITAL_COMMUNITY): Payer: Self-pay | Admitting: *Deleted

## 2015-08-24 ENCOUNTER — Encounter (HOSPITAL_COMMUNITY)
Admission: RE | Disposition: A | Discharge status: Home / Self Care | Payer: Self-pay | Source: Ambulatory Visit | Attending: Gastroenterology

## 2015-08-24 ENCOUNTER — Encounter (HOSPITAL_COMMUNITY): Payer: Self-pay | Admitting: *Deleted

## 2015-08-24 ENCOUNTER — Ambulatory Visit (HOSPITAL_COMMUNITY): Payer: Medicare Other | Admitting: Anesthesiology

## 2015-08-24 ENCOUNTER — Ambulatory Visit (HOSPITAL_COMMUNITY)
Admission: RE | Admit: 2015-08-24 | Discharge: 2015-08-24 | Disposition: A | Discharge status: Home / Self Care | Payer: Medicare Other | Source: Ambulatory Visit | Attending: Gastroenterology | Admitting: Gastroenterology

## 2015-08-24 DIAGNOSIS — Z86010 Personal history of colonic polyps: Secondary | ICD-10-CM | POA: Diagnosis not present

## 2015-08-24 DIAGNOSIS — K5730 Diverticulosis of large intestine without perforation or abscess without bleeding: Secondary | ICD-10-CM | POA: Diagnosis not present

## 2015-08-24 DIAGNOSIS — D123 Benign neoplasm of transverse colon: Secondary | ICD-10-CM | POA: Diagnosis not present

## 2015-08-24 DIAGNOSIS — I10 Essential (primary) hypertension: Secondary | ICD-10-CM | POA: Insufficient documentation

## 2015-08-24 DIAGNOSIS — K219 Gastro-esophageal reflux disease without esophagitis: Secondary | ICD-10-CM | POA: Insufficient documentation

## 2015-08-24 DIAGNOSIS — R195 Other fecal abnormalities: Secondary | ICD-10-CM | POA: Diagnosis not present

## 2015-08-24 HISTORY — PX: COLONOSCOPY WITH PROPOFOL: SHX5780

## 2015-08-24 SURGERY — COLONOSCOPY WITH PROPOFOL
Anesthesia: Monitor Anesthesia Care

## 2015-08-24 MED ORDER — SODIUM CHLORIDE 0.9 % IV SOLN: INTRAVENOUS | Status: DC

## 2015-08-24 MED ORDER — PROPOFOL 10 MG/ML IV BOLUS
INTRAVENOUS | Status: AC
  Filled 2015-08-24: qty 20

## 2015-08-24 MED ORDER — PROPOFOL 500 MG/50ML IV EMUL
INTRAVENOUS | Status: DC | PRN
  Administered 2015-08-24: 140 ug/kg/min via INTRAVENOUS

## 2015-08-24 MED ORDER — FENTANYL CITRATE (PF) 100 MCG/2ML IJ SOLN
INTRAMUSCULAR | Status: AC
  Filled 2015-08-24: qty 2

## 2015-08-24 MED ORDER — LIDOCAINE HCL (CARDIAC) 20 MG/ML IV SOLN
INTRAVENOUS | Status: AC
  Filled 2015-08-24: qty 5

## 2015-08-24 MED ORDER — FENTANYL CITRATE (PF) 100 MCG/2ML IJ SOLN
INTRAMUSCULAR | Status: DC | PRN
  Administered 2015-08-24 (×2): 25 ug via INTRAVENOUS

## 2015-08-24 MED ORDER — PROPOFOL 10 MG/ML IV BOLUS
INTRAVENOUS | Status: DC | PRN
  Administered 2015-08-24 (×4): 20 mg via INTRAVENOUS

## 2015-08-24 MED ORDER — LACTATED RINGERS IV SOLN
INTRAVENOUS | Status: DC
  Administered 2015-08-24: 1000 mL via INTRAVENOUS

## 2015-08-24 SURGICAL SUPPLY — 22 items

## 2015-08-24 NOTE — Anesthesia Postprocedure Evaluation (Signed)
Anesthesia Post Note  Patient: [name]  Procedure(s) Performed: Procedure(s) (LRB): COLONOSCOPY WITH PROPOFOL (N/A)  Anesthesia type: MAC  Patient location: PACU  Post pain: Pain level controlled  Post assessment: Patient's Cardiovascular Status Stable  Last Vitals:  Filed Vitals:   08/24/15 1230  BP: 123/79  Pulse: 73  Temp:   Resp: 11    Post vital signs: Reviewed and stable  Level of consciousness: sedated  Complications: No apparent anesthesia complications

## 2015-08-24 NOTE — Anesthesia Preprocedure Evaluation (Signed)
Anesthesia Evaluation  Patient identified by MRN, date of birth, ID band Patient awake    Reviewed: Allergy & Precautions, NPO status , Patient's Chart, lab work & pertinent test results  History of Anesthesia Complications (+) PONV  Airway Mallampati: I  TM Distance: >3 FB Neck ROM: Full    Dental   Pulmonary    Pulmonary exam normal        Cardiovascular hypertension, Pt. on medications Normal cardiovascular exam     Neuro/Psych    GI/Hepatic GERD  Medicated and Controlled,  Endo/Other    Renal/GU      Musculoskeletal   Abdominal   Peds  Hematology   Anesthesia Other Findings   Reproductive/Obstetrics                             Anesthesia Physical Anesthesia Plan  ASA: II  Anesthesia Plan: MAC   Post-op Pain Management:    Induction: Intravenous  Airway Management Planned: Natural Airway  Additional Equipment:   Intra-op Plan:   Post-operative Plan:   Informed Consent: I have reviewed the patients History and Physical, chart, labs and discussed the procedure including the risks, benefits and alternatives for the proposed anesthesia with the patient or authorized representative who has indicated his/her understanding and acceptance.     Plan Discussed with: CRNA and Surgeon  Anesthesia Plan Comments:         Anesthesia Quick Evaluation

## 2015-08-24 NOTE — Op Note (Signed)
Procedure: Surveillance colonoscopy. History of adenomatous colon polyps removed colonoscopically in the past.  Endoscopist: Earle Gell  Premedication: Propofol administered by anesthesia  Procedure: The patient was placed in the left lateral decubitus position. Anal inspection and digital rectal exam were normal. The Pentax pediatric colonoscope was introduced into the rectum and advanced to the cecum. A normal-appearing ileocecal valve and appendiceal orifice were identified. Colonic preparation for the exam today was good. Withdrawal time was 12 minutes  Rectum. Normal. Retroflexed view of the distal rectum was normal  Sigmoid colon and descending colon. Colonic diverticulosis  Splenic flexure. Normal  Transverse colon. From the mid transverse colon, a 5 mm sessile polyp was removed with the cold snare  Hepatic flexure. Normal  Ascending colon. From the distal ascending colon, two 5 mm sessile polyps were removed with the cold snare  Cecum and ileocecal valve. Normal  Assessment: A small polyp was removed from the transverse colon and two small polyps were removed from the ascending colon

## 2015-08-24 NOTE — Transfer of Care (Signed)
Immediate Anesthesia Transfer of Care Note  Patient: [name]  Procedure(s) Performed: Procedure(s): COLONOSCOPY WITH PROPOFOL (N/A)  Patient Location: Endoscopy Unit  Anesthesia Type:MAC  Level of Consciousness: awake, alert  and oriented  Airway & Oxygen Therapy: Patient Spontanous Breathing  Post-op Assessment: Report given to RN and Post -op Vital signs reviewed and stable  Post vital signs: Reviewed and stable  Last Vitals:  Filed Vitals:   08/24/15 1008  BP: 131/83  Temp: 36.5 C  Resp: 8    Complications: No apparent anesthesia complications

## 2015-08-24 NOTE — H&P (Signed)
  Procedure: Diagnostic colonoscopy to evaluate Hemoccult-positive stool.//2016 normal hemoglobin. 05/21/2007 normal surveillance colonoscopy. History of adenomatous colon polyps removed colonoscopically in the past  History: The patient is a 80 year old female born [birth year]. She submitted stool for Hemoccult testing and her stool was heme positive. She is scheduled to undergo a diagnostic colonoscopy today.  Past medical history: Hypertension. Hearing loss. Ovarian cyst. Appendectomy. Vaginal hysterectomy for uterine prolapse. Laparoscopic cholecystectomy. Cystocele repair. Laparoscopic bilateral salpingo-oophorectomy with lysis of adhesions.  Medication allergies: Amlodipine causes rash  Exam: Patient is alert and lying comfortably on the endoscopy stretcher. Abdomen is soft and nontender to palpation. Lungs are clear to auscultation. Cardiac exam reveals a regular rhythm.  Plan: Proceed with diagnostic colonoscopy to evaluate heme-positive stool.

## 2015-08-24 NOTE — Discharge Instructions (Signed)

## 2015-08-25 ENCOUNTER — Encounter (HOSPITAL_COMMUNITY): Payer: Self-pay | Admitting: Gastroenterology

## 2015-10-29 DIAGNOSIS — M79645 Pain in left finger(s): Secondary | ICD-10-CM | POA: Diagnosis not present

## 2015-10-29 DIAGNOSIS — M65312 Trigger thumb, left thumb: Secondary | ICD-10-CM | POA: Diagnosis not present

## 2015-11-15 ENCOUNTER — Ambulatory Visit
Admission: RE | Admit: 2015-11-15 | Discharge: 2015-11-15 | Disposition: A | Discharge status: Home / Self Care | Payer: PPO | Source: Ambulatory Visit | Attending: Internal Medicine | Admitting: Internal Medicine

## 2015-11-15 ENCOUNTER — Other Ambulatory Visit: Payer: Self-pay | Admitting: Internal Medicine

## 2015-11-15 DIAGNOSIS — R1032 Left lower quadrant pain: Secondary | ICD-10-CM

## 2015-11-15 DIAGNOSIS — N1330 Unspecified hydronephrosis: Secondary | ICD-10-CM | POA: Diagnosis not present

## 2015-11-15 DIAGNOSIS — N200 Calculus of kidney: Secondary | ICD-10-CM | POA: Diagnosis not present

## 2015-11-15 DIAGNOSIS — N132 Hydronephrosis with renal and ureteral calculous obstruction: Secondary | ICD-10-CM | POA: Diagnosis not present

## 2015-11-16 ENCOUNTER — Encounter (HOSPITAL_COMMUNITY)
Admission: AD | Disposition: A | Discharge status: Home / Self Care | Payer: Self-pay | Source: Ambulatory Visit | Attending: Urology

## 2015-11-16 ENCOUNTER — Ambulatory Visit (HOSPITAL_COMMUNITY): Payer: PPO | Admitting: Anesthesiology

## 2015-11-16 ENCOUNTER — Emergency Department (HOSPITAL_COMMUNITY)
Admission: EM | Admit: 2015-11-16 | Discharge: 2015-11-16 | Disposition: A | Discharge status: Home / Self Care | Payer: PPO | Source: Home / Self Care | Attending: Emergency Medicine | Admitting: Emergency Medicine

## 2015-11-16 ENCOUNTER — Other Ambulatory Visit: Payer: Self-pay | Admitting: Urology

## 2015-11-16 ENCOUNTER — Encounter (HOSPITAL_COMMUNITY): Payer: Self-pay

## 2015-11-16 ENCOUNTER — Encounter (HOSPITAL_COMMUNITY): Payer: Self-pay | Admitting: *Deleted

## 2015-11-16 ENCOUNTER — Ambulatory Visit (HOSPITAL_COMMUNITY)
Admission: AD | Admit: 2015-11-16 | Discharge: 2015-11-16 | Disposition: A | Discharge status: Home / Self Care | Payer: PPO | Source: Ambulatory Visit | Attending: Urology | Admitting: Urology

## 2015-11-16 DIAGNOSIS — R39198 Other difficulties with micturition: Secondary | ICD-10-CM

## 2015-11-16 DIAGNOSIS — R339 Retention of urine, unspecified: Secondary | ICD-10-CM | POA: Insufficient documentation

## 2015-11-16 DIAGNOSIS — Z0000 Encounter for general adult medical examination without abnormal findings: Secondary | ICD-10-CM | POA: Diagnosis not present

## 2015-11-16 DIAGNOSIS — N132 Hydronephrosis with renal and ureteral calculous obstruction: Secondary | ICD-10-CM | POA: Diagnosis not present

## 2015-11-16 DIAGNOSIS — Z79899 Other long term (current) drug therapy: Secondary | ICD-10-CM

## 2015-11-16 DIAGNOSIS — K219 Gastro-esophageal reflux disease without esophagitis: Secondary | ICD-10-CM | POA: Insufficient documentation

## 2015-11-16 DIAGNOSIS — Z8701 Personal history of pneumonia (recurrent): Secondary | ICD-10-CM | POA: Insufficient documentation

## 2015-11-16 DIAGNOSIS — M1990 Unspecified osteoarthritis, unspecified site: Secondary | ICD-10-CM | POA: Insufficient documentation

## 2015-11-16 DIAGNOSIS — N201 Calculus of ureter: Secondary | ICD-10-CM | POA: Diagnosis not present

## 2015-11-16 DIAGNOSIS — I10 Essential (primary) hypertension: Secondary | ICD-10-CM | POA: Insufficient documentation

## 2015-11-16 DIAGNOSIS — Z87442 Personal history of urinary calculi: Secondary | ICD-10-CM | POA: Insufficient documentation

## 2015-11-16 DIAGNOSIS — M8580 Other specified disorders of bone density and structure, unspecified site: Secondary | ICD-10-CM

## 2015-11-16 DIAGNOSIS — N200 Calculus of kidney: Secondary | ICD-10-CM | POA: Diagnosis not present

## 2015-11-16 HISTORY — PX: HOLMIUM LASER APPLICATION: SHX5852

## 2015-11-16 HISTORY — PX: CYSTOSCOPY WITH URETEROSCOPY AND STENT PLACEMENT: SHX6377

## 2015-11-16 LAB — BASIC METABOLIC PANEL
ANION GAP: 10 (ref 5–15)
BUN: 20 mg/dL (ref 6–20)
CALCIUM: 9.6 mg/dL (ref 8.9–10.3)
CO2: 29 mmol/L (ref 22–32)
Chloride: 101 mmol/L (ref 101–111)
Creatinine, Ser: 0.87 mg/dL (ref 0.44–1.00)
GLUCOSE: 101 mg/dL — AB (ref 65–99)
Potassium: 3.4 mmol/L — ABNORMAL LOW (ref 3.5–5.1)
Sodium: 140 mmol/L (ref 135–145)

## 2015-11-16 LAB — HEMOGLOBIN: Hemoglobin: 12.6 g/dL (ref 12.0–15.0)

## 2015-11-16 SURGERY — CYSTOURETEROSCOPY, WITH STENT INSERTION
Anesthesia: General | Site: Ureter | Laterality: Left

## 2015-11-16 MED ORDER — KETOROLAC TROMETHAMINE 15 MG/ML IJ SOLN
INTRAMUSCULAR | Status: DC | PRN
  Administered 2015-11-16: 15 mg via INTRAVENOUS

## 2015-11-16 MED ORDER — ONDANSETRON HCL 4 MG/2ML IJ SOLN: 4.0000 mg | Freq: Four times a day (QID) | INTRAMUSCULAR | Status: DC | PRN

## 2015-11-16 MED ORDER — BELLADONNA ALKALOIDS-OPIUM 16.2-60 MG RE SUPP
RECTAL | Status: AC
  Filled 2015-11-16: qty 1

## 2015-11-16 MED ORDER — PHENYLEPHRINE HCL 10 MG/ML IJ SOLN
INTRAMUSCULAR | Status: DC | PRN
  Administered 2015-11-16 (×5): 80 ug via INTRAVENOUS

## 2015-11-16 MED ORDER — CIPROFLOXACIN HCL 500 MG PO TABS: 500.0000 mg | ORAL_TABLET | Freq: Once | ORAL | Status: DC

## 2015-11-16 MED ORDER — ONDANSETRON HCL 4 MG/2ML IJ SOLN
INTRAMUSCULAR | Status: DC | PRN
  Administered 2015-11-16: 4 mg via INTRAVENOUS

## 2015-11-16 MED ORDER — ONDANSETRON HCL 4 MG/2ML IJ SOLN
INTRAMUSCULAR | Status: AC
  Filled 2015-11-16: qty 2

## 2015-11-16 MED ORDER — LIDOCAINE HCL 2 % EX GEL
CUTANEOUS | Status: AC
  Filled 2015-11-16: qty 5

## 2015-11-16 MED ORDER — EPHEDRINE SULFATE 50 MG/ML IJ SOLN
INTRAMUSCULAR | Status: DC | PRN
  Administered 2015-11-16: 10 mg via INTRAVENOUS

## 2015-11-16 MED ORDER — OXYCODONE HCL 5 MG PO TABS: 5.0000 mg | ORAL_TABLET | Freq: Once | ORAL | Status: DC | PRN

## 2015-11-16 MED ORDER — FENTANYL CITRATE (PF) 100 MCG/2ML IJ SOLN: 25.0000 ug | INTRAMUSCULAR | Status: DC | PRN

## 2015-11-16 MED ORDER — PROPOFOL 10 MG/ML IV BOLUS
INTRAVENOUS | Status: AC
  Filled 2015-11-16: qty 20

## 2015-11-16 MED ORDER — OXYCODONE HCL 5 MG/5ML PO SOLN
5.0000 mg | Freq: Once | ORAL | Status: DC | PRN
  Filled 2015-11-16: qty 5

## 2015-11-16 MED ORDER — LIDOCAINE HCL (CARDIAC) 20 MG/ML IV SOLN
INTRAVENOUS | Status: DC | PRN
  Administered 2015-11-16: 50 mg via INTRAVENOUS

## 2015-11-16 MED ORDER — FENTANYL CITRATE (PF) 100 MCG/2ML IJ SOLN
INTRAMUSCULAR | Status: AC
Start: 2015-11-16 — End: 2015-11-16
  Filled 2015-11-16: qty 2

## 2015-11-16 MED ORDER — PROPOFOL 10 MG/ML IV BOLUS
INTRAVENOUS | Status: DC | PRN
  Administered 2015-11-16: 50 mg via INTRAVENOUS
  Administered 2015-11-16: 200 mg via INTRAVENOUS

## 2015-11-16 MED ORDER — DEXAMETHASONE SODIUM PHOSPHATE 10 MG/ML IJ SOLN
INTRAMUSCULAR | Status: AC
Start: 2015-11-16 — End: 2015-11-16
  Filled 2015-11-16: qty 1

## 2015-11-16 MED ORDER — PHENAZOPYRIDINE HCL 200 MG PO TABS: 200.0000 mg | ORAL_TABLET | Freq: Three times a day (TID) | ORAL | Status: DC | PRN

## 2015-11-16 MED ORDER — BELLADONNA ALKALOIDS-OPIUM 16.2-60 MG RE SUPP
RECTAL | Status: DC | PRN
  Administered 2015-11-16: 1 via RECTAL

## 2015-11-16 MED ORDER — CIPROFLOXACIN IN D5W 400 MG/200ML IV SOLN
INTRAVENOUS | Status: AC
Start: 2015-11-16 — End: 2015-11-16
  Filled 2015-11-16: qty 200

## 2015-11-16 MED ORDER — IOHEXOL 300 MG/ML  SOLN
INTRAMUSCULAR | Status: DC | PRN
  Administered 2015-11-16: 7 mL

## 2015-11-16 MED ORDER — DEXAMETHASONE SODIUM PHOSPHATE 10 MG/ML IJ SOLN
INTRAMUSCULAR | Status: DC | PRN
  Administered 2015-11-16: 10 mg via INTRAVENOUS

## 2015-11-16 MED ORDER — FENTANYL CITRATE (PF) 100 MCG/2ML IJ SOLN
INTRAMUSCULAR | Status: DC | PRN
  Administered 2015-11-16 (×2): 50 ug via INTRAVENOUS

## 2015-11-16 MED ORDER — CIPROFLOXACIN IN D5W 400 MG/200ML IV SOLN
400.0000 mg | Freq: Two times a day (BID) | INTRAVENOUS | Status: DC
  Administered 2015-11-16: 400 mg via INTRAVENOUS

## 2015-11-16 MED ORDER — PHENYLEPHRINE 40 MCG/ML (10ML) SYRINGE FOR IV PUSH (FOR BLOOD PRESSURE SUPPORT)
PREFILLED_SYRINGE | INTRAVENOUS | Status: AC
  Filled 2015-11-16: qty 10

## 2015-11-16 MED ORDER — TROSPIUM CHLORIDE ER 60 MG PO CP24: 60.0000 mg | ORAL_CAPSULE | Freq: Every day | ORAL | Status: DC

## 2015-11-16 MED ORDER — LIDOCAINE HCL 2 % EX GEL
CUTANEOUS | Status: DC | PRN
  Administered 2015-11-16: 1 via URETHRAL

## 2015-11-16 MED ORDER — LACTATED RINGERS IV SOLN
INTRAVENOUS | Status: DC
  Administered 2015-11-16: 1000 mL via INTRAVENOUS
  Administered 2015-11-16 (×2): via INTRAVENOUS

## 2015-11-16 SURGICAL SUPPLY — 20 items
BAG URO CATCHER STRL LF (MISCELLANEOUS) ×3 IMPLANT
BASKET DAKOTA 1.9FR 11X120 (BASKET) ×2 IMPLANT
BASKET STONE NCOMPASS (UROLOGICAL SUPPLIES) ×2 IMPLANT
CATH URET 5FR 28IN OPEN ENDED (CATHETERS) ×2 IMPLANT
CLOTH BEACON ORANGE TIMEOUT ST (SAFETY) ×3 IMPLANT
FIBER LASER FLEXIVA 365 (UROLOGICAL SUPPLIES) ×2 IMPLANT
GLOVE BIO SURGEON STRL SZ7 (GLOVE) ×2 IMPLANT
GLOVE BIOGEL M STRL SZ7.5 (GLOVE) ×3 IMPLANT
GLOVE BIOGEL PI IND STRL 7.0 (GLOVE) IMPLANT
GLOVE BIOGEL PI INDICATOR 7.0 (GLOVE) ×2
GOWN STRL REUS W/TWL XL LVL3 (GOWN DISPOSABLE) ×5 IMPLANT
GUIDEWIRE STR DUAL SENSOR (WIRE) ×5 IMPLANT
IV NS 1000ML (IV SOLUTION) ×9
IV NS 1000ML BAXH (IV SOLUTION) IMPLANT
IV NS IRRIG 3000ML ARTHROMATIC (IV SOLUTION) ×2 IMPLANT
NS IRRIG 1000ML POUR BTL (IV SOLUTION) ×2 IMPLANT
PACK CYSTO (CUSTOM PROCEDURE TRAY) ×3 IMPLANT
STENT URET 6FRX24 CONTOUR (STENTS) ×2 IMPLANT
TUBING CONNECTING 10 (TUBING) ×2 IMPLANT
TUBING CONNECTING 10' (TUBING) ×1

## 2015-11-16 NOTE — Discharge Instructions (Signed)
Acute Urinary Retention, Female °Acute urinary retention is the temporary inability to urinate. This is an uncommon problem in women. It can be caused by: °· Infection. °· A side effect of a medicine. °· A problem in a nearby organ that presses or squeezes on the bladder or the urethra (the tube that drains the bladder). °· Psychological problems. °·  Surgery on your bladder, urethra, or pelvic organs that causes obstruction to the outflow of urine from your bladder. °HOME CARE INSTRUCTIONS  °If you are sent home with a Foley catheter and a drainage system, you will need to discuss the best course of action with your health care provider. While the catheter is in, maintain a good intake of fluids. Keep the drainage bag emptied and lower than your catheter. This is so that contaminated urine will not flow back into your bladder, which could lead to a urinary tract infection. °There are two main types of drainage bags. One is a large bag that usually is used at night. It has a good capacity that will allow you to sleep through the night without having to empty it. The second type is called a leg bag. It has a smaller capacity so it needs to be emptied more frequently. However, the main advantage is that it can be attached by a leg strap and goes underneath your clothing, allowing you the freedom to move about or leave your home. °Only take over-the-counter or prescription medicines for pain, discomfort, or fever as directed by your health care provider.  °SEEK MEDICAL CARE IF: °· You develop a low-grade fever. °· You experience spasms or leakage of urine with the spasms. °SEEK IMMEDIATE MEDICAL CARE IF:  °· You develop chills or fever. °· Your catheter stops draining urine. °· Your catheter falls out. °· You start to develop increased bleeding that does not respond to rest and increased fluid intake. °MAKE SURE YOU: °· Understand these instructions. °· Will watch your condition. °· Will get help right away if you are  not doing well or get worse. °  °This information is not intended to replace advice given to you by your health care provider. Make sure you discuss any questions you have with your health care provider. °  °Document Released: 10/01/2006 Document Revised: 02/16/2015 Document Reviewed: 03/13/2013 °Elsevier Interactive Patient Education ©2016 [employer] ° °

## 2015-11-16 NOTE — ED Notes (Signed)
Pt sent home with urinary catheter. Instructed on leg bag use and told to call urologist in the morning. Patient was alert, oriented and stable upon discharge. RN went over AVS and patient had no further questions.

## 2015-11-16 NOTE — Discharge Instructions (Signed)
DISCHARGE INSTRUCTIONS FOR KIDNEY STONE/URETERAL STENT   MEDICATIONS:  1.  Resume all your other meds from home - except do not take any extra narcotic pain meds that you may have at home.  2. Trospium is to prevent bladder spasms and help reduce urinary frequency. 3. Pyridium is to help with the burning/stinging when you urinate. 4. Ibuprofen 600mg  every 6 hours as needed for pain/discomfort is recommended as first line pain medication.  Also taking upto 1000mg  every 6 hours of plainTylenol will help treat your pain.   5. Take Cipro one hour prior to removal of your stent.   ACTIVITY:  1. No strenuous activity x 1week  2. No driving while on narcotic pain medications  3. Drink plenty of water  4. Continue to walk at home - you can still get blood clots when you are at home, so keep active, but don't over do it.  5. May return to work/school tomorrow or when you feel ready   BATHING:  1. You can shower and we recommend daily showers  2. You have a string coming from your urethra: The stent string is attached to your ureteral stent. Do not pull on this.   SIGNS/SYMPTOMS TO CALL:  Please call us if you have a fever greater than 101.5, uncontrolled nausea/vomiting, uncontrolled pain, dizziness, unable to urinate, bloody urine, chest pain, shortness of breath, leg swelling, leg pain, redness around wound, drainage from wound, or any other concerns or questions.   You can reach [place] at [phone].   FOLLOW-UP:  1. You have an appointment in 6 weeks with a ultrasound of your kidneys prior.   2. You have a string attached to your stent, you may remove it on Thursday afternoon, 11/18/15.  To do this, pull the strings until the stents are completely removed. You may feel an odd sensation in your back.

## 2015-11-16 NOTE — Anesthesia Postprocedure Evaluation (Signed)
Anesthesia Post Note  Patient: [name]  Procedure(s) Performed: Procedure(s) (LRB): CYSTOSCOPY WITH Left URETEROSCOPY, Left retrograde Pyelogram, Basket STone Extraction, Left Ureteral Stent (Left) HOLMIUM LASER APPLICATION (Left)  Patient location during evaluation: PACU Anesthesia Type: General Level of consciousness: awake and alert and patient cooperative Pain management: pain level controlled Vital Signs Assessment: post-procedure vital signs reviewed and stable Respiratory status: spontaneous breathing and respiratory function stable Cardiovascular status: stable Anesthetic complications: no    Last Vitals:  Filed Vitals:   11/16/15 1945 11/16/15 1953  BP:  121/80  Pulse:  78  Temp: 36.3 C   Resp: 16     Last Pain: There were no vitals filed for this visit.               [place]

## 2015-11-16 NOTE — H&P (Signed)
Reason For Visit left proximal ureteral stone   History of Present Illness 44F referred by Dr. Wenda Low, MD for eval and management of an 66m stone in the left proximal ureter. Her symptoms began 5 days ago and were intermittent. She describes intense sharp left flank pain with associated nausea and vomiting. A CT scan was done demonstrating bilateral non-obstructing stones and an 8 mm stone in the left proximal ureter with proximal hydro. The patient denies any fevers or chills. She has had some voiding symptoms including increased frequency and urgency. Her pain is predominantly in the left lower quadrant at this time. The patient has never had a kidney stone before.    She has a history of cystocele repair by Dr. EAmalia Haileyin 2006 using a vaginal mesh kit.   Past Medical History Problems  1. History of arthritis (Z87.39) 2. History of esophageal reflux (Z87.19)  Surgical History Problems  1. History of Appendectomy 2. History of Bladder Surgery 3. History of Cholecystectomy 4. History of Hysterectomy 5. History of Oophorectomy  Current Meds 1. Losartan Potassium TABS;  Therapy: (Recorded:31Jan2017) to Recorded 2. Multi Vitamin/Minerals TABS;  Therapy: (Recorded:31Jan2017) to Recorded  Allergies Medication  1. No Known Drug Allergies Non-Medication  2. Contrast Dye  Family History Problems  1. Family history of Death of family member : Mother, Father 2. Family history of diabetes mellitus (Z83.3) : Brother  Social History Problems    Alcohol use (Z78.9)   2 a week   Denied: History of Caffeine use   Never a smoker   Number of children   2 sons; 1 daughter   Retired  Review of Systems Genitourinary, constitutional, skin, eye, otolaryngeal, hematologic/lymphatic, cardiovascular, pulmonary, endocrine, musculoskeletal, gastrointestinal, neurological and psychiatric system(s) were reviewed and pertinent findings if present are noted and are otherwise negative.     Vitals Vital Signs [Data Includes: Last 1 Day]  Recorded: [id number]:41AM  Height: 5 ft 7 in Weight: 158 lb  BMI Calculated: 24.75 BSA Calculated: 1.83 Blood Pressure: 108 / 75 Temperature: 98.3 F Heart Rate: 70  Physical Exam Constitutional: Well nourished and well developed . No acute distress.  ENT:. The ears and nose are normal in appearance.  Neck: The appearance of the neck is normal and no neck mass is present.  Pulmonary: No respiratory distress and normal respiratory rhythm and effort.  Cardiovascular: Heart rate and rhythm are normal . No peripheral edema.  Abdomen: The abdomen is soft and nontender. No masses are palpated. Tenderness in the LLQ is present, but no LUQ tenderness. Left CVA tenderness. No hernias are palpable. No hepatosplenomegaly noted.  Skin: Normal skin turgor, no visible rash and no visible skin lesions.  Neuro/Psych:. Mood and affect are appropriate.    Results/Data Urine [Data Includes: Last 1 Day]   [id number] COLOR YELLOW   APPEARANCE CLEAR   SPECIFIC GRAVITY 1.015   pH 5.5   GLUCOSE NEGATIVE   BILIRUBIN NEGATIVE   KETONE NEGATIVE   BLOOD 1+   PROTEIN NEGATIVE   NITRITE NEGATIVE   LEUKOCYTE ESTERASE 1+   SQUAMOUS EPITHELIAL/HPF 0-5 HPF  WBC 6-10 WBC/HPF  RBC 3-10 RBC/HPF  BACTERIA FEW HPF  CRYSTALS NONE SEEN HPF  CASTS NONE SEEN LPF  Yeast NONE SEEN HPF   Urinalysis today demonstrates microscopic hematuria, urine culture was sent  I have independently reviewed the patient's CT scan with the above findings.   Assessment Assessed  1. Left ureteral calculus (N20.1) 2. Nephrolithiasis (N20.0)  Plan Health Maintenance  1. UA With REFLEX; [Do Not Release]; Status:Complete;   Done: [id number] 08:29AM Left ureteral calculus  2. Follow-up Schedule Surgery Office  Follow-up  Status: Complete  Done: [id number] 5. URINE CULTURE; Status:In Progress - Specimen/Data Collected;   Done: [id number]  Discussion/Summary The patient has a  7-8 mm proximal left ureteral stone. We discussed the treatment options including medical expulsion therapy, shockwave lithotripsy, and ureteroscopy. Given the patient's timeframe, I recommended that she proceed to the OR urgently for ureteroscopy, laser lithotripsy and stent placement. We discussed the surgery in quite detail. I also went over the risks and benefits thereof. The patient understands that she will have stent following the operation with the associated discomfort.

## 2015-11-16 NOTE — ED Notes (Signed)
Patient was alert, oriented and stable upon discharge. RN went over AVS and patient had no further questions.  

## 2015-11-16 NOTE — Anesthesia Preprocedure Evaluation (Signed)
Anesthesia Evaluation  Patient identified by MRN, date of birth, ID band Patient awake    Reviewed: Allergy & Precautions, NPO status , Patient's Chart, lab work & pertinent test results  History of Anesthesia Complications (+) PONV  Airway Mallampati: II   Neck ROM: full    Dental   Pulmonary neg pulmonary ROS,    breath sounds clear to auscultation       Cardiovascular hypertension,  Rhythm:regular Rate:Normal     Neuro/Psych    GI/Hepatic GERD  ,  Endo/Other    Renal/GU      Musculoskeletal   Abdominal   Peds  Hematology   Anesthesia Other Findings   Reproductive/Obstetrics                             Anesthesia Physical Anesthesia Plan  ASA: II  Anesthesia Plan: General   Post-op Pain Management:    Induction: Intravenous  Airway Management Planned: LMA  Additional Equipment:   Intra-op Plan:   Post-operative Plan:   Informed Consent: I have reviewed the patients History and Physical, chart, labs and discussed the procedure including the risks, benefits and alternatives for the proposed anesthesia with the patient or authorized representative who has indicated his/her understanding and acceptance.     Plan Discussed with: CRNA, Anesthesiologist and Surgeon  Anesthesia Plan Comments:         Anesthesia Quick Evaluation

## 2015-11-16 NOTE — Progress Notes (Signed)
sm amt bloody dng noted from rt nare.

## 2015-11-16 NOTE — Op Note (Signed)
Preoperative diagnosis:  1.  left proximal ureteral stone  Postoperative diagnosis:  1. same   Procedure: 1. Cystoscopy, left retrograde pyelogram with interpretation 2. Left ureteroscopy, laser lithotripsy, stone extraction 3. Left ureteral stent placement   Surgeon: Ardis Hughs, MD  Anesthesia: General  Complications: None  Intraoperative findings: Left retropyelogram was performed using 10 mL of Omnipaque contrast. This showed a normal caliber ureter to the proximal ureter on the level of L3. At that point there was a filling defect and moderate hydronephrosis proximal. Large proximal stone with associated proximal hydronephrosis. All large stone fragments had been removed prior to terminating the case. A 24 cm times 6 French double-J stent was left in the patient's vagina with a string tether attached and instructions to remove in 48 hours.   EBL: Minimal  Specimens: stone fragment was taken to [facility] for special pathology, stone analysis.   Indication: [name] is a 81 y.o. patient with large left proximal ureteral stone.  After reviewing the management options for treatment, he elected to proceed with the above surgical procedure(s). We have discussed the potential benefits and risks of the procedure, side effects of the proposed treatment, the likelihood of the patient achieving the goals of the procedure, and any potential problems that might occur during the procedure or recuperation. Informed consent has been obtained.  Description of procedure:  The patient was taken to the operating room and general anesthesia was induced.  The patient was placed in the dorsal lithotomy position, prepped and draped in the usual sterile fashion, and preoperative antibiotics were administered. A preoperative time-out was performed.   A 30 21 French cystoscope was then gently passed to the patient's urethra and in the bladder. A 360 cystoscopic evaluation was then  performed with no significant abnormalities noted. The ureteral orifices were in orthotopic position. I then cannulated the left ureter with a 5 [place] open-ended ureteral catheter and injected 10 mL of a contrast performing a left retrograde fiber with the above findings. I then advanced a 0.38 sensor wire through the 5 [place] open-ended ureteral catheter and into the left renal pelvis. I then removed the open-ended as well as the cystoscope draining the bladder prior to the scope being removed. I then advanced a 4.5 French semirigid ureteroscope through the patient's ureteral orifice and into the left ureter using a second wire as a guide. I encountered the stone fragment in the proximal left ureter. I then used a 365  laser fiber with settings of point 6 J and 6 Hz and fragmented the stone into numerous small fragments. Several of the fragments were pushed up into the renal pelvis. I then used a [employer] and basketed the stone fragments that were remaining in the ureter. I then advanced a second wire 0.38 sensor into the left renal pelvis through the ureteroscope removing the scope over the wire. I then advanced a flexible ureteroscope over the second wire and into the left renal pelvis removing the second wire. I then performed pyeloscopy and inspected the calyces. There are numerous fragments in the upper and middle calyx which I was able to remove with the [place] basket. I then reinspected the kidney ensuring that all fragments I been removed. I did remove several fragments into the ureter and then exchanged the [place] basket for an encompass basket which I was able to sweep the ureter clean. I then emptied the patient's bladder removing all the stone fragments. I then backloaded the safety wire through  the cystoscope and passed a 24 cm times 6 French double-J stent into the patient's left ureter under fluoroscopic guidance. A curl was noted to be in the left renal pelvis as well as in the bladder. Stent  tether was left intact and tucked into the patient's vagina. A B and O suppository was then placed in the patient's rectum. Urethral lidocaine jelly was inserted.   The patient was subsequently awoken and returned to PACU next condition.  Ardis Hughs, M.D.

## 2015-11-16 NOTE — ED Notes (Signed)
Pt had a kidney stone removed this evening and hasn't urinated since 8pm

## 2015-11-16 NOTE — Transfer of Care (Signed)
Immediate Anesthesia Transfer of Care Note  Patient: [name]  Procedure(s) Performed: Procedure(s): CYSTOSCOPY WITH Left URETEROSCOPY, Left retrograde Pyelogram, Basket STone Extraction, Left Ureteral Stent (Left) HOLMIUM LASER APPLICATION (Left)  Patient Location: PACU  Anesthesia Type:General  Level of Consciousness: awake and alert   Airway & Oxygen Therapy: Patient Spontanous Breathing and Patient connected to face mask oxygen  Post-op Assessment: Report given to RN and Post -op Vital signs reviewed and stable  Post vital signs: Reviewed and stable  Last Vitals:  Filed Vitals:   11/16/15 1359  BP: 126/83  Pulse: 75  Temp: 36.3 C  Resp: 16    Complications: No apparent anesthesia complications

## 2015-11-16 NOTE — ED Provider Notes (Signed)
CSN: [id number]     Arrival date & time 11/16/15  2241 History  By signing my name below, I, [name], attest that this documentation has been prepared under the direction and in the presence of Catherine Schmidt, MD. Electronically Signed: Sonum Myers, Education administrator. 11/16/2015. 12:04 AM.    Chief Complaint  Patient presents with  . Urinary Retention    The history is provided by the patient and a relative. No language interpreter was used.     HPI Comments: [name] is a 81 y.o. female who presents to the Emergency Department complaining of difficulty urinating that began over the last 3 hours. Patient had a kidney stone removed and a ureteral stent placed on 11/16/15. She last voided 3 hours ago while at the hospital. She denies aggravating or alleviating factors. She does not complain of any other associated symptoms as this time.    Past Medical History  Diagnosis Date  . Osteopenia 06/2008  . Pneumonia 2009  . PONV (postoperative nausea and vomiting)   . Hypertension   . Mild dietary indigestion   . GERD (gastroesophageal reflux disease)    Past Surgical History  Procedure Laterality Date  . Appendectomy    . Cholecystectomy    . Bladder suspension    . Vaginal hysterectomy      ovaries remain  . Eye surgery Bilateral     /W IOL  . Colonoscopy    . Wisdom tooth extraction    . Laparoscopic bilateral salpingo oopherectomy Bilateral 09/28/2014    Procedure: LAPAROSCOPIC BILATERAL SALPINGO OOPHORECTOMY; lysis of adhesions;  Surgeon: Lyman Speller, MD;  Location: [facility];  Service: Gynecology;  Laterality: Bilateral;  . Cystoscopy N/A 09/28/2014    Procedure: CYSTOSCOPY ;  Surgeon: Lyman Speller, MD;  Location: [facility];  Service: Gynecology;  Laterality: N/A;  . Colonoscopy with propofol N/A 08/24/2015    Procedure: COLONOSCOPY WITH PROPOFOL;  Surgeon: Garlan Fair, MD;  Location: [facility];  Service: Endoscopy;  Laterality: N/A;   Family History  Problem  Relation Age of Onset  . Cancer Father     ?colon ca  . Diabetes Brother   . CAD Mother 3   Social History  Substance Use Topics  . Smoking status: Never Smoker   . Smokeless tobacco: Never Used  . Alcohol Use: 1.5 oz/week    3 Standard drinks or equivalent per week     Comment: occas. wine   OB History    Gravida Para Term Preterm AB TAB SAB Ectopic Multiple Living   3 3 3       3      Review of Systems  Genitourinary: Positive for difficulty urinating.  All other systems reviewed and are negative.  Allergies  Ambien and Iohexol  Home Medications   Prior to Admission medications   Medication Sig Start Date End Date Taking? Authorizing Provider  ciprofloxacin (CIPRO) 500 MG tablet Take 1 tablet (500 mg total) by mouth once. Take one hour prior to removal of your stent 11/16/15  Yes Ardis Hughs, MD  famotidine (PEPCID) 20 MG tablet Take 20 mg by mouth daily as needed for heartburn or indigestion.   Yes Historical Provider, MD  losartan-hydrochlorothiazide (HYZAAR) 50-12.5 MG per tablet Take 1 tablet by mouth every morning.    Yes Historical Provider, MD  Multiple Vitamin (MULTIVITAMIN WITH MINERALS) TABS tablet Take 1 tablet by mouth daily.   Yes Historical Provider, MD  phenazopyridine (PYRIDIUM) 200 MG tablet Take 1  tablet (200 mg total) by mouth 3 (three) times daily as needed for pain. 11/16/15  Yes Ardis Hughs, MD  Trospium Chloride 60 MG CP24 Take 1 capsule (60 mg total) by mouth daily. 11/16/15  Yes Ardis Hughs, MD  Vitamin D, Ergocalciferol, (DRISDOL) 50000 UNITS CAPS capsule TAKE 1 CAPSULE EVERY 14 DAYS Patient not taking: Reported on 11/16/2015 01/27/15   Catherine Salon, MD   BP 145/95 mmHg  Pulse 93  Resp 22  SpO2 100% Physical Exam  Constitutional: She is oriented to person, place, and time. She appears well-developed and well-nourished.  HENT:  Head: Normocephalic.  Eyes: EOM are normal.  Neck: Normal range of motion.  Cardiovascular: Normal  rate.   Pulmonary/Chest: Effort normal.  Abdominal: Soft. She exhibits no distension. There is no tenderness.  Musculoskeletal: Normal range of motion.  Neurological: She is alert and oriented to person, place, and time.  Psychiatric: She has a normal mood and affect.  Nursing note and vitals reviewed.   ED Course  Procedures (including critical care time)  DIAGNOSTIC STUDIES: Oxygen Saturation is 100% on RA, normal by my interpretation.    COORDINATION OF CARE: 11:18 PM Discussed treatment plan with pt at bedside and pt agreed to plan.   Labs Review Labs Reviewed - No data to display  Imaging Review  I have personally reviewed and evaluated these images and lab results as part of my medical decision-making.   EKG Interpretation None      MDM   Final diagnoses:  None    Acute urinary retention.  Symptoms resolved after Foley catheter placed.  Patient will go home with catheter in place and urology follow-up.  She understands to return to the ER for new or worsening symptoms  I personally performed the services described in this documentation, which was scribed in my presence. The recorded information has been reviewed and is accurate.       Catherine Schmidt, MD 11/17/15 [phone]

## 2015-11-17 ENCOUNTER — Encounter (HOSPITAL_COMMUNITY): Payer: Self-pay | Admitting: Urology

## 2015-11-18 DIAGNOSIS — N201 Calculus of ureter: Secondary | ICD-10-CM | POA: Diagnosis not present

## 2015-11-22 DIAGNOSIS — N201 Calculus of ureter: Secondary | ICD-10-CM | POA: Diagnosis not present

## 2015-12-10 ENCOUNTER — Ambulatory Visit: Payer: Medicare Other | Admitting: Obstetrics & Gynecology

## 2015-12-28 DIAGNOSIS — N201 Calculus of ureter: Secondary | ICD-10-CM | POA: Diagnosis not present

## 2015-12-28 DIAGNOSIS — Z0000 Encounter for general adult medical examination without abnormal findings: Secondary | ICD-10-CM | POA: Diagnosis not present

## 2016-01-18 IMAGING — MR MR KNEE*L* W/O CM
3 of 5 series · 9 of 40 positions shown · non-contrast
Comparison: None.

CLINICAL DATA: Left knee pain with catching and swelling and
stiffness since January 2014. Locking and giving way.

EXAM:
MRI OF THE LEFT KNEE WITHOUT CONTRAST
TECHNIQUE: Multiplanar, multisequence MR imaging of the knee was performed. No
intravenous contrast was administered.

[Series 2: PD · axial · 4.0mm · 0.21mm/px · z∈[-51,+35]mm · 3 of 24 slices shown (1 of 2)]
[im 3/24]
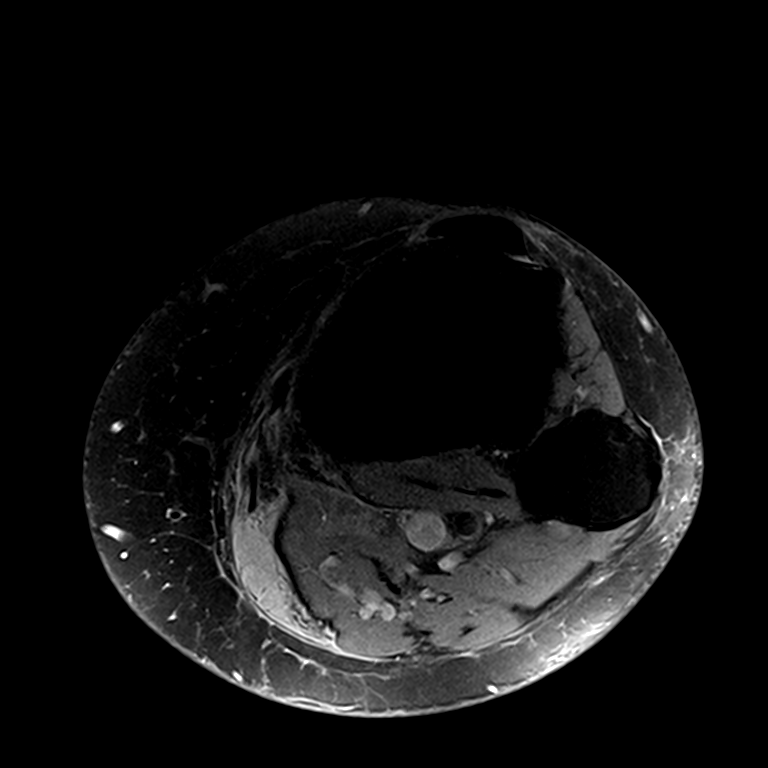
[im 12/24]
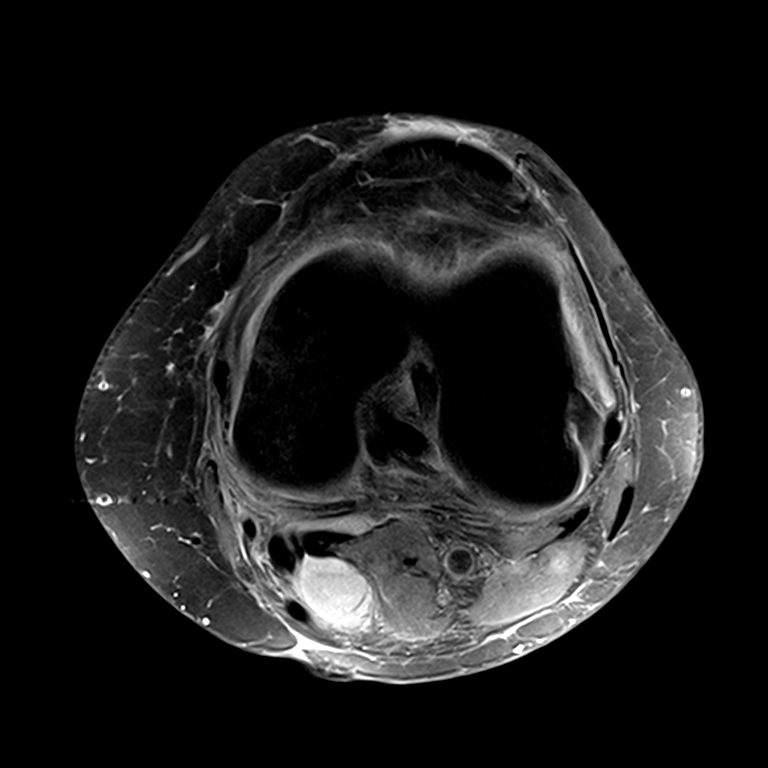
[im 21/24]
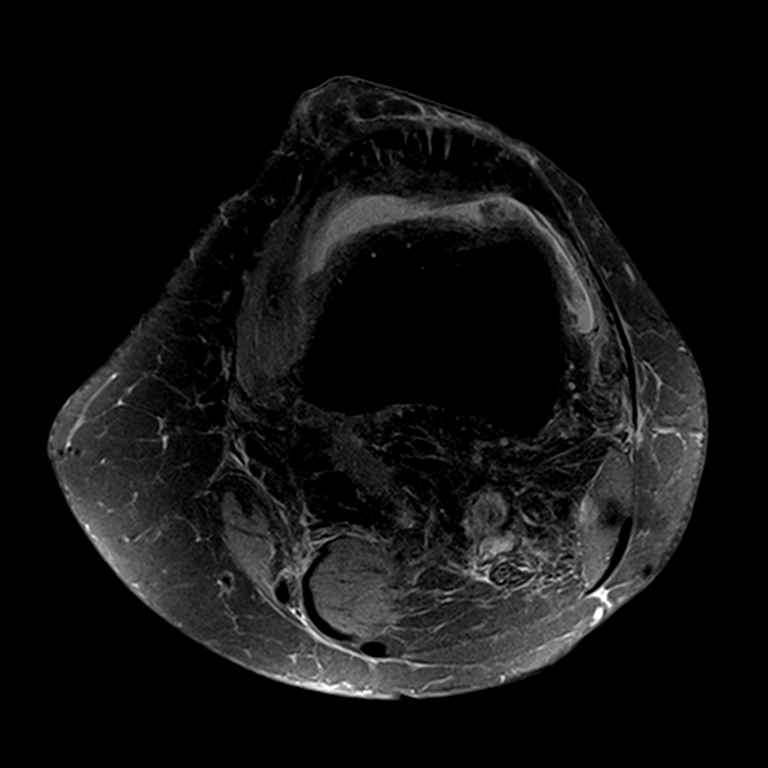

[Series 3: PD · coronal · 4.0mm · 0.22mm/px · 3 of 18 slices shown (2 of 2)]
[im 3/18]
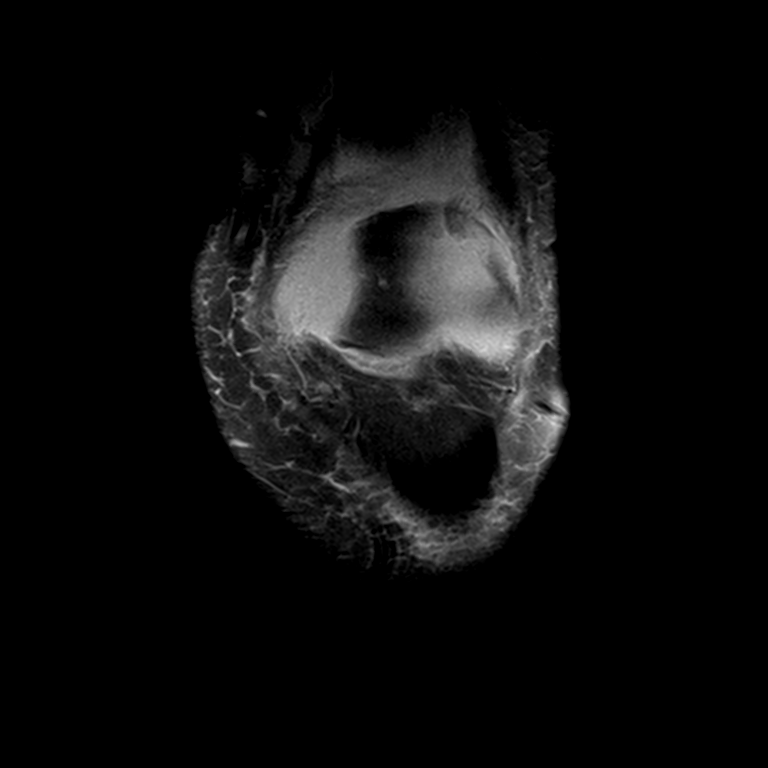
[im 9/18]
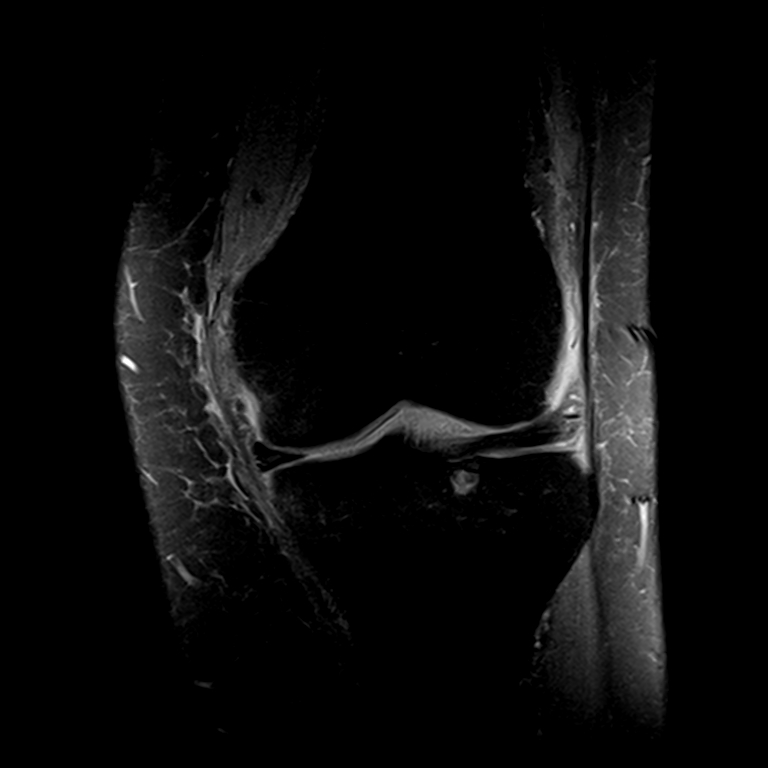
[im 15/18]
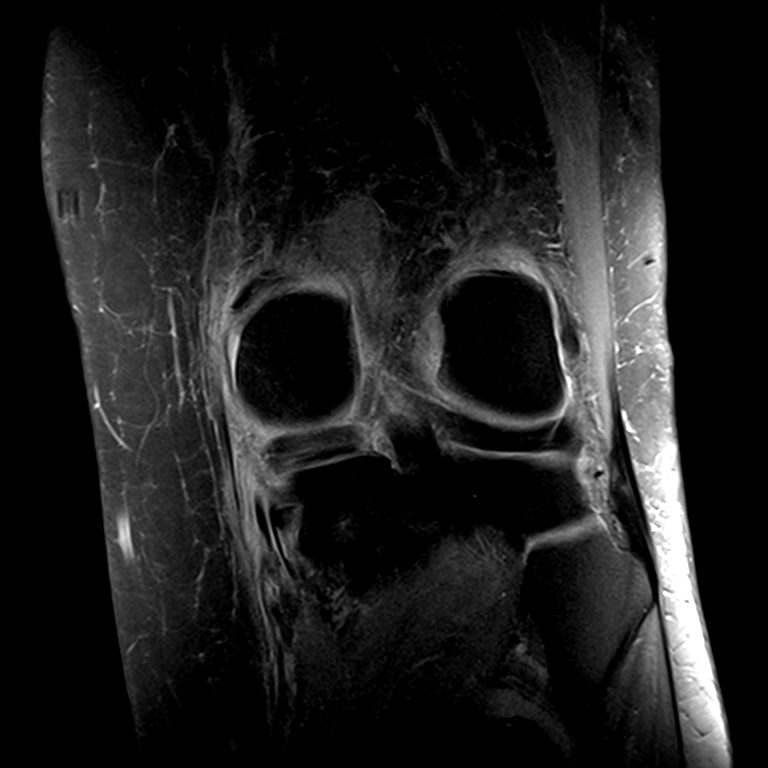

[Series 4: PD fat-sat · sagittal · 3.0mm · 0.25mm/px · 3 of 35 slices shown]
[im 7/35]
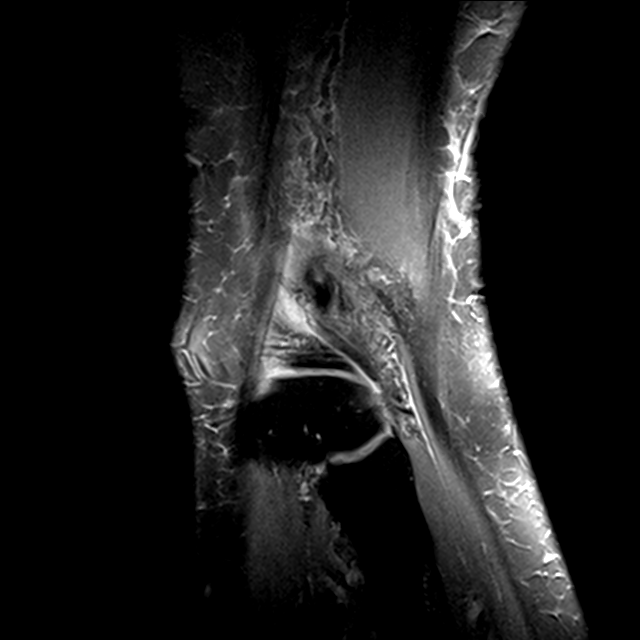
[im 19/35]
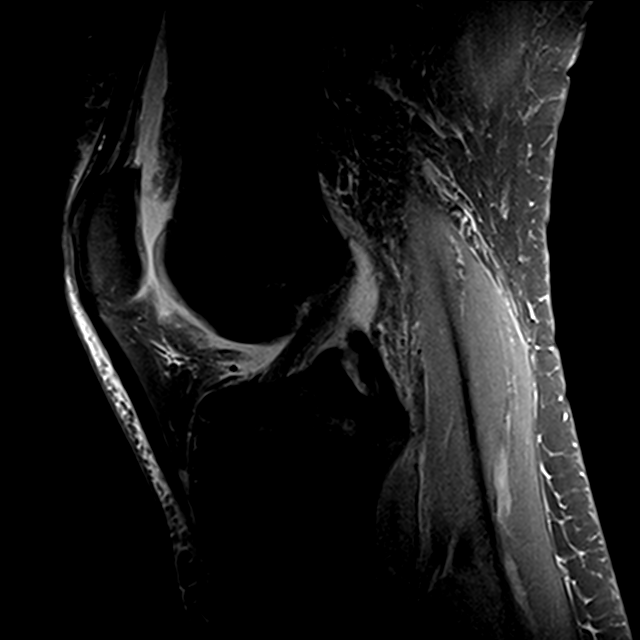
[im 31/35]
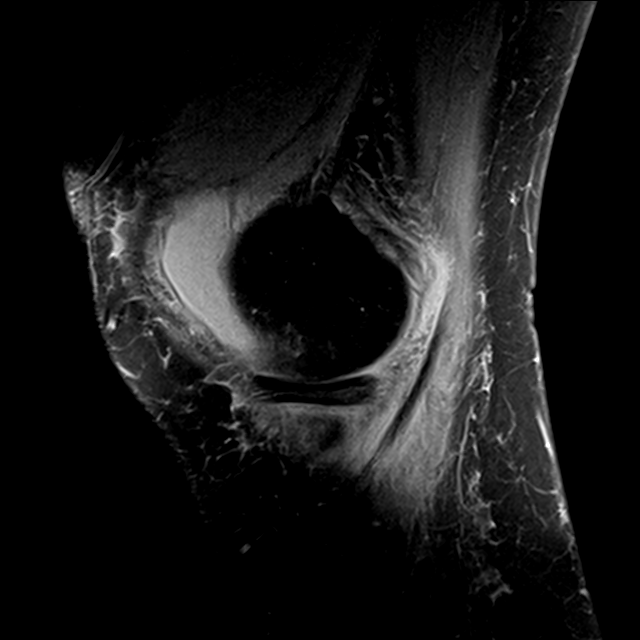

[9 of 40 positions shown; findings below may reference images not displayed]

FINDINGS: MENISCI

Medial meniscus: There is an oblique complete radial tear of the
root of the posterior horn of the medial meniscus with secondary
peripheral subluxation. Secondary subcortical edema in the medial
femoral condyle and medial tibial plateau.

Lateral meniscus: Intact. Degeneration of free edge of the midbody.

LIGAMENTS

Cruciates:  Normal.

Collaterals:  Normal.

CARTILAGE

Patellofemoral: Focal areas of cartilage thinning on the medial and
lateral facets.

Medial: The thinning of the articular cartilage of the periphery of
the medial tibial plateau.

Lateral:  Normal.

Joint:  Moderate joint effusion.

Popliteal Fossa:  8.5 x 2 x 2 cm Baker's cyst.

Extensor Mechanism:  Normal.

Bones: Subcortical edema in the medial femoral condyle and medial
tibial plateau. Small intraosseous ganglion in the anterior medial
aspect of the lateral tibial plateau, not felt to be significant.
IMPRESSION: 1. Complete oblique radial tear of the root of the posterior horn of
the medial meniscus with secondary meniscal subluxation and
secondary stress edema in the medial tibial plateau and medial
femoral condyle.
2. Moderate joint effusion with Baker's cyst.

## 2016-01-19 DIAGNOSIS — Z23 Encounter for immunization: Secondary | ICD-10-CM | POA: Diagnosis not present

## 2016-01-19 DIAGNOSIS — R152 Fecal urgency: Secondary | ICD-10-CM | POA: Diagnosis not present

## 2016-01-19 DIAGNOSIS — Z0000 Encounter for general adult medical examination without abnormal findings: Secondary | ICD-10-CM | POA: Diagnosis not present

## 2016-01-19 DIAGNOSIS — Z1389 Encounter for screening for other disorder: Secondary | ICD-10-CM | POA: Diagnosis not present

## 2016-01-19 DIAGNOSIS — I10 Essential (primary) hypertension: Secondary | ICD-10-CM | POA: Diagnosis not present

## 2016-02-28 DIAGNOSIS — D1800 Hemangioma unspecified site: Secondary | ICD-10-CM | POA: Diagnosis not present

## 2016-02-28 DIAGNOSIS — D229 Melanocytic nevi, unspecified: Secondary | ICD-10-CM | POA: Diagnosis not present

## 2016-02-28 DIAGNOSIS — L570 Actinic keratosis: Secondary | ICD-10-CM | POA: Diagnosis not present

## 2016-02-28 DIAGNOSIS — L920 Granuloma annulare: Secondary | ICD-10-CM | POA: Diagnosis not present

## 2016-02-28 DIAGNOSIS — L821 Other seborrheic keratosis: Secondary | ICD-10-CM | POA: Diagnosis not present

## 2016-02-28 DIAGNOSIS — L820 Inflamed seborrheic keratosis: Secondary | ICD-10-CM | POA: Diagnosis not present

## 2016-03-10 ENCOUNTER — Ambulatory Visit: Payer: Medicare Other | Admitting: Obstetrics & Gynecology

## 2016-03-20 ENCOUNTER — Ambulatory Visit (INDEPENDENT_AMBULATORY_CARE_PROVIDER_SITE_OTHER): Payer: PPO | Admitting: Obstetrics & Gynecology

## 2016-03-20 ENCOUNTER — Encounter: Payer: Self-pay | Admitting: Obstetrics & Gynecology

## 2016-03-20 VITALS — BP 110/80 | HR 68 | Resp 16 | Ht 66.5 in | Wt 162.0 lb

## 2016-03-20 DIAGNOSIS — Z01419 Encounter for gynecological examination (general) (routine) without abnormal findings: Secondary | ICD-10-CM | POA: Diagnosis not present

## 2016-03-20 NOTE — Progress Notes (Addendum)
Patient ID: [name], female   DOB: December 27, 1934, 81 y.o.   MRN: [id number]   81 y.o. G3P3003 WidowedCaucasianF here for annual exam.  Had nephrolithiasis in January.  Had cystoscopy and stone removal.  Had to go back to ER later in the evening due to urinary retention.  Three days later went to [place] with [facility] trip.  Pt reports 3 month follow up was done with ultrasound.  She has two more stones on the opposite side.  She has two additional trips this year to [place] and then [place] later in the year.    Denies vaginal bleeding.     PCP:  Dr. Laurann Montana.  Last appt was earlier this year.    No LMP recorded. Patient has had a hysterectomy.          Sexually active: No.  The current method of family planning is post menopausal status.    Exercising: Yes.    patient is very active  Smoker:  no  Health Maintenance: Pap:  04/2009 WNL History of abnormal Pap:  no MMG:  09/02/15 WNL BI RADS1 Colonoscopy:  11/16 with Dr. Wynetta Emery.  2 polyps were found. BMD:   Unsure of last one TDaP:  Pt is sure this is UTD Pneumonia vaccine(s): YES PCP Zostavax:   Yes PCP Hep C testing: No Screening Labs: PCP does labs    reports that she has never smoked. She has never used smokeless tobacco. She reports that she drinks about 1.5 oz of alcohol per week. She reports that she does not use illicit drugs.  Past Medical History  Diagnosis Date  . Osteopenia 06/2008  . Pneumonia 2009  . PONV (postoperative nausea and vomiting)   . Hypertension   . Mild dietary indigestion   . GERD (gastroesophageal reflux disease)   . History of kidney stones     Past Surgical History  Procedure Laterality Date  . Appendectomy    . Cholecystectomy    . Bladder suspension    . Vaginal hysterectomy      ovaries remain  . Eye surgery Bilateral     /W IOL  . Colonoscopy    . Wisdom tooth extraction    . Laparoscopic bilateral salpingo oopherectomy Bilateral 09/28/2014    Procedure: LAPAROSCOPIC  BILATERAL SALPINGO OOPHORECTOMY; lysis of adhesions;  Surgeon: Lyman Speller, MD;  Location: [facility];  Service: Gynecology;  Laterality: Bilateral;  . Cystoscopy N/A 09/28/2014    Procedure: CYSTOSCOPY ;  Surgeon: Lyman Speller, MD;  Location: [facility];  Service: Gynecology;  Laterality: N/A;  . Colonoscopy with propofol N/A 08/24/2015    Procedure: COLONOSCOPY WITH PROPOFOL;  Surgeon: Garlan Fair, MD;  Location: [facility];  Service: Endoscopy;  Laterality: N/A;  . Cystoscopy with ureteroscopy and stent placement Left 11/16/2015    Procedure: CYSTOSCOPY WITH Left URETEROSCOPY, Left retrograde Pyelogram, Basket STone Extraction, Left Ureteral Stent;  Surgeon: Ardis Hughs, MD;  Location: [facility];  Service: Urology;  Laterality: Left;  . Holmium laser application Left [id number]    Procedure: HOLMIUM LASER APPLICATION;  Surgeon: Ardis Hughs, MD;  Location: [facility];  Service: Urology;  Laterality: Left;    Current Outpatient Prescriptions  Medication Sig Dispense Refill  . losartan-hydrochlorothiazide (HYZAAR) 50-12.5 MG per tablet Take 1 tablet by mouth every morning.     . Multiple Vitamin (MULTIVITAMIN WITH MINERALS) TABS tablet Take 1 tablet by mouth daily.    . Probiotic Product (PROBIOTIC FORMULA PO) Take by  mouth.     No current facility-administered medications for this visit.    Family History  Problem Relation Age of Onset  . Cancer Father     ?colon ca  . Diabetes Brother   . CAD Mother 38  . Pancreatic cancer Brother     ROS:  Pertinent items are noted in HPI.  Otherwise, a comprehensive ROS was negative.  Exam:   BP 110/80 mmHg  Pulse 68  Resp 16  Ht 5' 6.5" (1.689 m)  Wt 162 lb (73.483 kg)  BMI 25.76 kg/m2    Height: 5' 6.5" (168.9 cm)  Ht Readings from Last 3 Encounters:  03/20/16 5' 6.5" (1.689 m)  11/16/15 5\' 7"  (1.702 m)  08/24/15 5' 7.5" (1.715 m)    General appearance: alert, cooperative and appears stated age Head: Normocephalic,  without obvious abnormality, atraumatic Neck: no adenopathy, supple, symmetrical, trachea midline and thyroid normal to inspection and palpation Lungs: clear to auscultation bilaterally Breasts: normal appearance, no masses or tenderness Heart: regular rate and rhythm Abdomen: soft, non-tender; bowel sounds normal; no masses,  no organomegaly Extremities: extremities normal, atraumatic, no cyanosis or edema Skin: Skin color, texture, turgor normal. No rashes or lesions Lymph nodes: Cervical, supraclavicular, and axillary nodes normal. No abnormal inguinal nodes palpated Neurologic: Grossly normal   Pelvic: External genitalia:  no lesions              Urethra:  normal appearing urethra with no masses, tenderness or lesions              Bartholins and Skenes: normal                 Vagina: normal appearing vagina with normal color and discharge, no lesions, 3rd degree rectocele noted              Cervix: absent              Pap taken: No. Bimanual Exam:  Uterus:  uterus absent              Adnexa: no mass, fullness, tenderness               Rectovaginal: Confirms               Anus:  normal sphincter tone, no lesions  Chaperone was present for exam.  A:  Well Woman with normal exam Vit D deficiency Rectocele.  Pt declines PT or other treatment options. PMP, off HRT Osteopenia S/p laparoscopic BSO 12/15  P: Mammogram yearly pap smear not obtained Labs with PCP and vaccines are UTD return annually or prn

## 2016-03-20 NOTE — Progress Notes (Deleted)
81 y.o. [id number] Widowed{Race/ethnicity:17218}F here for annual exam.    No LMP recorded. Patient has had a hysterectomy.          Sexually active: {yes no:314532}  The current method of family planning is {contraception:315051}.    Exercising: {yes no:314532}  {types:19826} Smoker:  {YES NO:22349}  Health Maintenance: Pap:  04/2009 Neg History of abnormal Pap:  no MMG:  09/02/15 BIRADS1:neg Colonoscopy:  2008 [facility]  BMD:   06/2008  TDaP:  *** Pneumonia vaccine(s):  *** Zostavax:   *** Hep C testing: *** Screening Labs: ***, Hb today: ***, Urine today: ***   reports that she has never smoked. She has never used smokeless tobacco. She reports that she drinks about 1.5 oz of alcohol per week. She reports that she does not use illicit drugs.  Past Medical History  Diagnosis Date  . Osteopenia 06/2008  . Pneumonia 2009  . PONV (postoperative nausea and vomiting)   . Hypertension   . Mild dietary indigestion   . GERD (gastroesophageal reflux disease)     Past Surgical History  Procedure Laterality Date  . Appendectomy    . Cholecystectomy    . Bladder suspension    . Vaginal hysterectomy      ovaries remain  . Eye surgery Bilateral     /W IOL  . Colonoscopy    . Wisdom tooth extraction    . Laparoscopic bilateral salpingo oopherectomy Bilateral 09/28/2014    Procedure: LAPAROSCOPIC BILATERAL SALPINGO OOPHORECTOMY; lysis of adhesions;  Surgeon: Lyman Speller, MD;  Location: [facility];  Service: Gynecology;  Laterality: Bilateral;  . Cystoscopy N/A 09/28/2014    Procedure: CYSTOSCOPY ;  Surgeon: Lyman Speller, MD;  Location: [facility];  Service: Gynecology;  Laterality: N/A;  . Colonoscopy with propofol N/A 08/24/2015    Procedure: COLONOSCOPY WITH PROPOFOL;  Surgeon: Garlan Fair, MD;  Location: [facility];  Service: Endoscopy;  Laterality: N/A;  . Cystoscopy with ureteroscopy and stent placement Left 11/16/2015    Procedure: CYSTOSCOPY WITH Left URETEROSCOPY,  Left retrograde Pyelogram, Basket STone Extraction, Left Ureteral Stent;  Surgeon: Ardis Hughs, MD;  Location: [facility];  Service: Urology;  Laterality: Left;  . Holmium laser application Left [id number]    Procedure: HOLMIUM LASER APPLICATION;  Surgeon: Ardis Hughs, MD;  Location: [facility];  Service: Urology;  Laterality: Left;    Current Outpatient Prescriptions  Medication Sig Dispense Refill  . ciprofloxacin (CIPRO) 500 MG tablet Take 1 tablet (500 mg total) by mouth once. Take one hour prior to removal of your stent 1 tablet 0  . famotidine (PEPCID) 20 MG tablet Take 20 mg by mouth daily as needed for heartburn or indigestion.    Marland Kitchen losartan-hydrochlorothiazide (HYZAAR) 50-12.5 MG per tablet Take 1 tablet by mouth every morning.     . Multiple Vitamin (MULTIVITAMIN WITH MINERALS) TABS tablet Take 1 tablet by mouth daily.    . phenazopyridine (PYRIDIUM) 200 MG tablet Take 1 tablet (200 mg total) by mouth 3 (three) times daily as needed for pain. 10 tablet 0  . Trospium Chloride 60 MG CP24 Take 1 capsule (60 mg total) by mouth daily. 7 each 0  . Vitamin D, Ergocalciferol, (DRISDOL) 50000 UNITS CAPS capsule TAKE 1 CAPSULE EVERY 14 DAYS (Patient not taking: Reported on 11/16/2015) 6 capsule 1   No current facility-administered medications for this visit.    Family History  Problem Relation Age of Onset  . Cancer Father     ?colon  ca  . Diabetes Brother   . CAD Mother 57    ROS:  Pertinent items are noted in HPI.  Otherwise, a comprehensive ROS was negative.  Exam:   There were no vitals taken for this visit.  Weight change: @WEIGHTCHANGE @ Height:      Ht Readings from Last 3 Encounters:  11/16/15 5\' 7"  (1.702 m)  08/24/15 5' 7.5" (1.715 m)  09/23/14 5' 7.5" (1.715 m)    General appearance: alert, cooperative and appears stated age Head: Normocephalic, without obvious abnormality, atraumatic Neck: no adenopathy, supple, symmetrical, trachea midline and thyroid {EXAM;  THYROID:18604} Lungs: clear to auscultation bilaterally Breasts: {Exam; breast:13139::"normal appearance, no masses or tenderness"} Heart: regular rate and rhythm Abdomen: soft, non-tender; bowel sounds normal; no masses,  no organomegaly Extremities: extremities normal, atraumatic, no cyanosis or edema Skin: Skin color, texture, turgor normal. No rashes or lesions Lymph nodes: Cervical, supraclavicular, and axillary nodes normal. No abnormal inguinal nodes palpated Neurologic: Grossly normal   Pelvic: External genitalia:  no lesions              Urethra:  normal appearing urethra with no masses, tenderness or lesions              Bartholins and Skenes: normal                 Vagina: normal appearing vagina with normal color and discharge, no lesions              Cervix: {exam; cervix:14595}              Pap taken: {yes no:314532} Bimanual Exam:  Uterus:  {exam; uterus:12215}              Adnexa: {exam; adnexa:12223}               Rectovaginal: Confirms               Anus:  normal sphincter tone, no lesions  Chaperone was present for exam.  A:  Well Woman with normal exam  P:   {plan; gyn:5269::"mammogram","pap smear","return annually or prn"}

## 2016-03-20 NOTE — Addendum Note (Signed)
Addended by: Megan Salon on: 03/20/2016 02:10 PM   Modules accepted: Miquel Dunn

## 2016-05-15 ENCOUNTER — Other Ambulatory Visit: Payer: Self-pay | Admitting: Urology

## 2016-05-15 DIAGNOSIS — N200 Calculus of kidney: Secondary | ICD-10-CM | POA: Diagnosis not present

## 2016-05-15 DIAGNOSIS — N209 Urinary calculus, unspecified: Secondary | ICD-10-CM | POA: Diagnosis not present

## 2016-05-17 ENCOUNTER — Encounter (HOSPITAL_COMMUNITY): Payer: Self-pay | Admitting: *Deleted

## 2016-05-17 NOTE — H&P (Signed)
Office Visit Report     05/15/2016   --------------------------------------------------------------------------------   [name]  MRN: [id number]  PRIMARY CARE:  Delanna Ahmadi, MD  DOB: 12-26-1934, 81 year old Female  REFERRING:  Wenda Low, MD  SSN: [id number]  PROVIDER:  Louis Meckel, M.D.    LOCATION:  [facility] [phone]   --------------------------------------------------------------------------------   CC: Non-obstructing stone follow-up  HPI: [name] is a 81 year-old female established patient who is here today for interval evaluation of non-obstructing kidney stones.  The patient was last seen [facility] 2014.   The patient has not passed any stones since they were last seen. has not had any flank pain since in the interval.   The patient denies any progressive voiding symptoms. She denies dysuria. She does not have hematuria. She has not had fever and chills.   She has had kidney stone surgery. She underwent ureteroscopy. Stone composition: calcium oxalate. The patient has not completed a 24 hour urine collection in the past. The patient is taking not.   The patient underwent kub prior to today's appointment.   CT scan from 10/2015 shows 23mm right non-obstructing stone     ALLERGIES: Contrast Dye No Allergies    MEDICATIONS: Align  Losartan Potassium TABS Oral  Multi Vitamin/Minerals TABS Oral  Turmeric     GU PSH: Cysto/Uretero W/Lithotripsy - 11/24/2015 Hysterectomy Unilat SO - 2015/12/07 Ovary Removal Partial or Total - Dec 07, 2015      PSH Notes: Cystoscopy Ureteroscopy Lithotripsy Incl Insert Indwelling Ureter Stent, Oophorectomy, Cholecystectomy, Bladder Surgery, Appendectomy, Hysterectomy   NON-GU PSH: Appendectomy - 12-07-2015 Cholecystectomy - 2015-12-07    GU PMH: Calculus Ureter, Left ureteral calculus - 11/19/2015 Kidney Stone, Nephrolithiasis - 12-07-2015    NON-GU PMH: Encounter for general adult medical examination  without abnormal findings, Encounter for preventive health examination - 12/28/2015 Personal history of other diseases of the digestive system, History of esophageal reflux - 12-07-2015 Personal history of other diseases of the musculoskeletal system and connective tissue, History of arthritis - 12-07-2015    FAMILY HISTORY: Death of family member - Runs In Family Diabetes - Runs In Family   SOCIAL HISTORY: Marital Status: Widowed     Notes: Never a smoker, Retired, Caffeine use, Alcohol use, Number of children   REVIEW OF SYSTEMS:    GU Review Female:   Patient denies frequent urination, hard to postpone urination, burning /pain with urination, get up at night to urinate, leakage of urine, stream starts and stops, trouble starting your stream, have to strain to urinate, and currently pregnant.  Gastrointestinal (Upper):   Patient denies nausea, vomiting, and indigestion/ heartburn.  Gastrointestinal (Lower):   Patient denies diarrhea and constipation.  Constitutional:   Patient denies fever, night sweats, weight loss, and fatigue.  Skin:   Patient denies skin rash/ lesion and itching.  Eyes:   Patient denies blurred vision and double vision.  Ears/ Nose/ Throat:   Patient denies sore throat and sinus problems.  Hematologic/Lymphatic:   Patient denies swollen glands and easy bruising.  Cardiovascular:   Patient denies leg swelling and chest pains.  Respiratory:   Patient denies cough and shortness of breath.  Endocrine:   Patient denies excessive thirst.  Musculoskeletal:   Patient denies back pain and joint pain.  Neurological:   Patient denies headaches and dizziness.  Psychologic:   Patient denies depression and anxiety.   VITAL SIGNS:      05/15/2016 08:19 AM  BP 134/85 mmHg  Pulse  62 /min   MULTI-SYSTEM PHYSICAL EXAMINATION:    Constitutional: Well-nourished. No physical deformities. Normally developed. Good grooming.   Respiratory: No labored breathing, no use of accessory  muscles. CTA-B  Cardiovascular: Normal temperature, normal extremity pulses, no swelling, no varicosities. RRR  Gastrointestinal: No mass, no tenderness, no rigidity, non obese abdomen.     PAST DATA REVIEWED:  Source Of History:  Patient  Records Review:   Previous Doctor Records, Previous Patient Records  Urine Test Review:   Urinalysis  X-Ray Review: C.T. Abdomen/Pelvis: Reviewed Films. Reviewed Report. Discussed With Patient.     PROCEDURES:         KUB - 74000  A single view of the abdomen is obtained. A single view of the abdomen is obtained. The renal shadows were visualized  82mm stone in the right lower pole - no other stones within the renal shadows. There are no additional stones within the expected trajectory of the ureters down into the renal pelvis. There are no boney structure abnormalities The gas pattern is grossly normal.                Urinalysis - 81003 Dipstick Dipstick Cont'd  Specimen: Voided Bilirubin: Neg  Color: Straw Ketones: Neg  Appearance: Clear Blood: Neg  Specific Gravity: 1.010 Protein: Neg  pH: 6.0 Urobilinogen: 0.2  Glucose: Neg Nitrites: Neg    Leukocyte Esterase: Neg    ASSESSMENT:      ICD-10 Details  1 GU:   Kidney Stone - N20.0    PLAN:           Orders Labs Urine Culture and Sensitivity  X-Rays: KUB          Schedule Return Visit: ASAP - Schedule Surgery          Document Letter(s):  Created for Patient: Clinical Summary    We discussed management options including medical expulsion therapy, shockwave lithotripsy, and ureteroscopy. Ultimately, the patient has opted for shock wave lithotripsy. I discussed with the patient the procedure in detail as well as the risk and benefits. The patient is aware that she may need additional procedures. She also is aware of the risks of hematoma and pain. We will try to get this patient's scheduled as soon as possible.         Notes:   She is going to [place] in one month, would  like to get her stone taken care of prior to leaving on her trip.    * Signed by Louis Meckel, M.D. on 05/15/16 at 9:30 PM (EDT)*

## 2016-05-18 ENCOUNTER — Ambulatory Visit (HOSPITAL_COMMUNITY): Payer: PPO

## 2016-05-18 ENCOUNTER — Encounter (HOSPITAL_COMMUNITY): Payer: Self-pay | Admitting: General Practice

## 2016-05-18 ENCOUNTER — Encounter (HOSPITAL_COMMUNITY)
Admission: RE | Disposition: A | Discharge status: Home / Self Care | Payer: Self-pay | Source: Ambulatory Visit | Attending: Urology

## 2016-05-18 ENCOUNTER — Ambulatory Visit (HOSPITAL_COMMUNITY)
Admission: RE | Admit: 2016-05-18 | Discharge: 2016-05-18 | Disposition: A | Discharge status: Home / Self Care | Payer: PPO | Source: Ambulatory Visit | Attending: Urology | Admitting: Urology

## 2016-05-18 DIAGNOSIS — Z87442 Personal history of urinary calculi: Secondary | ICD-10-CM | POA: Insufficient documentation

## 2016-05-18 DIAGNOSIS — I10 Essential (primary) hypertension: Secondary | ICD-10-CM | POA: Insufficient documentation

## 2016-05-18 DIAGNOSIS — Z79899 Other long term (current) drug therapy: Secondary | ICD-10-CM | POA: Insufficient documentation

## 2016-05-18 DIAGNOSIS — Z01818 Encounter for other preprocedural examination: Secondary | ICD-10-CM | POA: Diagnosis not present

## 2016-05-18 DIAGNOSIS — N200 Calculus of kidney: Secondary | ICD-10-CM

## 2016-05-18 SURGERY — LITHOTRIPSY, ESWL
Anesthesia: LOCAL | Laterality: Right

## 2016-05-18 MED ORDER — DIPHENHYDRAMINE HCL 25 MG PO CAPS
25.0000 mg | ORAL_CAPSULE | ORAL | Status: AC
  Administered 2016-05-18: 25 mg via ORAL
  Filled 2016-05-18: qty 1

## 2016-05-18 MED ORDER — CIPROFLOXACIN HCL 500 MG PO TABS
500.0000 mg | ORAL_TABLET | ORAL | Status: AC
  Administered 2016-05-18: 500 mg via ORAL
  Filled 2016-05-18: qty 1

## 2016-05-18 MED ORDER — SODIUM CHLORIDE 0.9 % IV SOLN
INTRAVENOUS | Status: DC
  Administered 2016-05-18: 08:00:00 via INTRAVENOUS

## 2016-05-18 MED ORDER — DIAZEPAM 5 MG PO TABS
10.0000 mg | ORAL_TABLET | ORAL | Status: AC
  Administered 2016-05-18: 10 mg via ORAL
  Filled 2016-05-18: qty 2

## 2016-05-18 NOTE — Discharge Instructions (Signed)
1. You should strain your urine and collect all fragments and bring them to your follow up appointment.  2. You should take your pain medication as needed.  Please call if your pain is severe to the point that it is not controlled with your pain medication. 3. You should call if you develop fever > 101 or persistent nausea or vomiting.   

## 2016-05-18 NOTE — Interval H&P Note (Signed)
History and Physical Interval Note:  05/18/2016 10:25 AM  [name]  has presented today for surgery, with the diagnosis of RIGHT RENAL STONE  The various methods of treatment have been discussed with the patient and family. After consideration of risks, benefits and other options for treatment, the patient has consented to  Procedure(s): RIGHT EXTRACORPOREAL SHOCK WAVE LITHOTRIPSY (ESWL) (Right) as a surgical intervention .  The patient's history has been reviewed, patient examined, no change in status, stable for surgery.  I have reviewed the patient's chart and labs.  Questions were answered to the patient's satisfaction.     Breniyah Romm,LES

## 2016-06-08 DIAGNOSIS — N200 Calculus of kidney: Secondary | ICD-10-CM | POA: Diagnosis not present

## 2016-06-09 DIAGNOSIS — H5712 Ocular pain, left eye: Secondary | ICD-10-CM | POA: Diagnosis not present

## 2016-08-07 DIAGNOSIS — H52203 Unspecified astigmatism, bilateral: Secondary | ICD-10-CM | POA: Diagnosis not present

## 2016-08-07 DIAGNOSIS — H35372 Puckering of macula, left eye: Secondary | ICD-10-CM | POA: Diagnosis not present

## 2016-08-07 DIAGNOSIS — Z961 Presence of intraocular lens: Secondary | ICD-10-CM | POA: Diagnosis not present

## 2016-09-11 DIAGNOSIS — N200 Calculus of kidney: Secondary | ICD-10-CM | POA: Diagnosis not present

## 2016-09-11 DIAGNOSIS — Z1231 Encounter for screening mammogram for malignant neoplasm of breast: Secondary | ICD-10-CM | POA: Diagnosis not present

## 2016-09-26 DIAGNOSIS — L239 Allergic contact dermatitis, unspecified cause: Secondary | ICD-10-CM | POA: Diagnosis not present

## 2016-12-07 DIAGNOSIS — L570 Actinic keratosis: Secondary | ICD-10-CM | POA: Diagnosis not present

## 2016-12-07 DIAGNOSIS — D1801 Hemangioma of skin and subcutaneous tissue: Secondary | ICD-10-CM | POA: Diagnosis not present

## 2016-12-07 DIAGNOSIS — L821 Other seborrheic keratosis: Secondary | ICD-10-CM | POA: Diagnosis not present

## 2016-12-07 DIAGNOSIS — L814 Other melanin hyperpigmentation: Secondary | ICD-10-CM | POA: Diagnosis not present

## 2016-12-07 DIAGNOSIS — D225 Melanocytic nevi of trunk: Secondary | ICD-10-CM | POA: Diagnosis not present

## 2017-01-18 DIAGNOSIS — Z1389 Encounter for screening for other disorder: Secondary | ICD-10-CM | POA: Diagnosis not present

## 2017-01-18 DIAGNOSIS — Z0000 Encounter for general adult medical examination without abnormal findings: Secondary | ICD-10-CM | POA: Diagnosis not present

## 2017-02-16 DIAGNOSIS — R42 Dizziness and giddiness: Secondary | ICD-10-CM | POA: Diagnosis not present

## 2017-02-23 DIAGNOSIS — R300 Dysuria: Secondary | ICD-10-CM | POA: Diagnosis not present

## 2017-03-04 DIAGNOSIS — R5383 Other fatigue: Secondary | ICD-10-CM | POA: Diagnosis not present

## 2017-03-04 DIAGNOSIS — M791 Myalgia: Secondary | ICD-10-CM | POA: Diagnosis not present

## 2017-03-13 DIAGNOSIS — R6883 Chills (without fever): Secondary | ICD-10-CM | POA: Diagnosis not present

## 2017-03-21 DIAGNOSIS — R5383 Other fatigue: Secondary | ICD-10-CM | POA: Diagnosis not present

## 2017-03-21 DIAGNOSIS — I10 Essential (primary) hypertension: Secondary | ICD-10-CM | POA: Diagnosis not present

## 2017-08-14 DIAGNOSIS — Z961 Presence of intraocular lens: Secondary | ICD-10-CM | POA: Diagnosis not present

## 2017-08-14 DIAGNOSIS — H52203 Unspecified astigmatism, bilateral: Secondary | ICD-10-CM | POA: Diagnosis not present

## 2017-09-28 DIAGNOSIS — Z1231 Encounter for screening mammogram for malignant neoplasm of breast: Secondary | ICD-10-CM | POA: Diagnosis not present

## 2017-10-01 DIAGNOSIS — R05 Cough: Secondary | ICD-10-CM | POA: Diagnosis not present

## 2017-10-01 DIAGNOSIS — I10 Essential (primary) hypertension: Secondary | ICD-10-CM | POA: Diagnosis not present

## 2017-10-19 DIAGNOSIS — J209 Acute bronchitis, unspecified: Secondary | ICD-10-CM | POA: Diagnosis not present

## 2017-10-24 ENCOUNTER — Telehealth: Payer: Self-pay | Admitting: Obstetrics & Gynecology

## 2017-10-24 NOTE — Telephone Encounter (Signed)
Patient has a small pea size bump near vagina. Last visit 03/20/16

## 2017-10-24 NOTE — Telephone Encounter (Signed)
Spoke with patient. Patient states that she noticed a bump near her vagina a few months ago. Bump has grown in size to the size of a small pea. Denies any pain or redness to the area. Unable to see the area well, but feels the bump is flesh colored. Patient would like to be seen. Appointment scheduled for 10/29/2017 at 10:45 am with Dr.Miller. Patient is agreeable to date and time.  Routing to provider for final review. Patient agreeable to disposition. Will close encounter.

## 2017-10-29 ENCOUNTER — Other Ambulatory Visit: Payer: Self-pay

## 2017-10-29 ENCOUNTER — Ambulatory Visit (INDEPENDENT_AMBULATORY_CARE_PROVIDER_SITE_OTHER): Payer: PPO | Admitting: Obstetrics & Gynecology

## 2017-10-29 ENCOUNTER — Encounter: Payer: Self-pay | Admitting: Obstetrics & Gynecology

## 2017-10-29 VITALS — BP 102/64 | HR 88 | Resp 16 | Ht 67.5 in | Wt 165.0 lb

## 2017-10-29 DIAGNOSIS — L723 Sebaceous cyst: Secondary | ICD-10-CM

## 2017-10-29 NOTE — Progress Notes (Signed)
GYNECOLOGY  VISIT  CC:   Check vaginal bump  HPI: 83 y.o. G59P3003 Widowed Caucasian female here for vaginal bump she noticed a few months ago.  At first was very small but has gotten larger.  Feels it is a little larger than a hard pea.  Is non-tender.  There is no drainage.  Denies vaginal bleeding or discharge.  Leaving the country tomorrow for [place] and wants to know what to do.  GYNECOLOGIC HISTORY: No LMP recorded. Patient has had a hysterectomy. Contraception: hysterectomy  Menopausal hormone therapy: none  Patient Active Problem List   Diagnosis Date Noted  . Essential hypertension, benign 09/28/2014  . Abnormal EKG 05/18/2014  . Preop cardiovascular exam 05/18/2014  . Other and unspecified ovarian cyst 10/15/2013  . Osteopenia     Past Medical History:  Diagnosis Date  . GERD (gastroesophageal reflux disease)   . History of kidney stones   . Hypertension   . Mild dietary indigestion   . Osteopenia 06/2008  . Pneumonia 2009  . PONV (postoperative nausea and vomiting)     Past Surgical History:  Procedure Laterality Date  . APPENDECTOMY    . BLADDER SUSPENSION    . CHOLECYSTECTOMY    . COLONOSCOPY    . COLONOSCOPY WITH PROPOFOL N/A 08/24/2015   Procedure: COLONOSCOPY WITH PROPOFOL;  Surgeon: Garlan Fair, MD;  Location: [facility];  Service: Endoscopy;  Laterality: N/A;  . CYSTOSCOPY N/A 09/28/2014   Procedure: CYSTOSCOPY ;  Surgeon: Lyman Speller, MD;  Location: [facility];  Service: Gynecology;  Laterality: N/A;  . CYSTOSCOPY WITH URETEROSCOPY AND STENT PLACEMENT Left 11/16/2015   Procedure: CYSTOSCOPY WITH Left URETEROSCOPY, Left retrograde Pyelogram, Basket STone Extraction, Left Ureteral Stent;  Surgeon: Ardis Hughs, MD;  Location: [facility];  Service: Urology;  Laterality: Left;  . EYE SURGERY Bilateral    /W IOL  . HOLMIUM LASER APPLICATION Left 6/44/0347   Procedure: HOLMIUM LASER APPLICATION;  Surgeon: Ardis Hughs, MD;  Location: [facility];   Service: Urology;  Laterality: Left;  . LAPAROSCOPIC BILATERAL SALPINGO OOPHERECTOMY Bilateral 09/28/2014   Procedure: LAPAROSCOPIC BILATERAL SALPINGO OOPHORECTOMY; lysis of adhesions;  Surgeon: Lyman Speller, MD;  Location: [facility];  Service: Gynecology;  Laterality: Bilateral;  . VAGINAL HYSTERECTOMY     ovaries remain  . WISDOM TOOTH EXTRACTION      MEDS:   Current Outpatient Medications on File Prior to Visit  Medication Sig Dispense Refill  . losartan-hydrochlorothiazide (HYZAAR) 50-12.5 MG per tablet Take 1 tablet by mouth every morning.     . Multiple Vitamin (MULTIVITAMIN) tablet Take 1 tablet by mouth daily.    . Probiotic Product (PROBIOTIC FORMULA PO) Take by mouth.    . TURMERIC PO Take by mouth.     No current facility-administered medications on file prior to visit.     ALLERGIES: Ambien [zolpidem]; Hydrocodeine [dihydrocodeine]; and Iohexol  Family History  Problem Relation Age of Onset  . Cancer Father        ?colon ca  . Diabetes Brother   . CAD Mother 36  . Pancreatic cancer Brother     SH:  Widowed, non smoker  Review of Systems  All other systems reviewed and are negative.   PHYSICAL EXAMINATION:    BP 102/64 (BP Location: Right Arm, Patient Position: Sitting, Cuff Size: Large)   Pulse 88   Resp 16   Ht 5' 7.5" (1.715 m)   Wt 165 lb (74.8 kg)   BMI 25.46 kg/m  Physical Exam  Constitutional: She is oriented to person, place, and time. She appears well-developed and well-nourished.  Genitourinary: Vagina normal.    There is lesion on the right labia. There is no rash, tenderness or injury on the right labia. There is no rash, tenderness, lesion or injury on the left labia.  Neurological: She is alert and oriented to person, place, and time.  Skin: Skin is warm and dry.  Psychiatric: She has a normal mood and affect.   Chaperone was present for exam.  Assessment: Vulvar sebaceous cyst  Plan: Advised leaving this alone unless  continues to enlarge, becomes tender or becomes infected.  Pt asks about removal which I would not recommend until after she returns from [place] but advised it is fine to just watch.  She will consider and if desires removal, will call after back from her trip.

## 2017-11-23 DIAGNOSIS — J209 Acute bronchitis, unspecified: Secondary | ICD-10-CM | POA: Diagnosis not present

## 2018-01-22 DIAGNOSIS — R350 Frequency of micturition: Secondary | ICD-10-CM | POA: Diagnosis not present

## 2018-01-23 DIAGNOSIS — L820 Inflamed seborrheic keratosis: Secondary | ICD-10-CM | POA: Diagnosis not present

## 2018-01-23 DIAGNOSIS — L821 Other seborrheic keratosis: Secondary | ICD-10-CM | POA: Diagnosis not present

## 2018-01-23 DIAGNOSIS — D225 Melanocytic nevi of trunk: Secondary | ICD-10-CM | POA: Diagnosis not present

## 2018-01-23 DIAGNOSIS — D485 Neoplasm of uncertain behavior of skin: Secondary | ICD-10-CM | POA: Diagnosis not present

## 2018-01-23 DIAGNOSIS — D224 Melanocytic nevi of scalp and neck: Secondary | ICD-10-CM | POA: Diagnosis not present

## 2018-01-23 DIAGNOSIS — L570 Actinic keratosis: Secondary | ICD-10-CM | POA: Diagnosis not present

## 2018-01-23 DIAGNOSIS — L920 Granuloma annulare: Secondary | ICD-10-CM | POA: Diagnosis not present

## 2018-01-23 DIAGNOSIS — L720 Epidermal cyst: Secondary | ICD-10-CM | POA: Diagnosis not present

## 2018-01-23 DIAGNOSIS — L814 Other melanin hyperpigmentation: Secondary | ICD-10-CM | POA: Diagnosis not present

## 2018-01-23 DIAGNOSIS — C4441 Basal cell carcinoma of skin of scalp and neck: Secondary | ICD-10-CM | POA: Diagnosis not present

## 2018-01-23 DIAGNOSIS — D1801 Hemangioma of skin and subcutaneous tissue: Secondary | ICD-10-CM | POA: Diagnosis not present

## 2018-01-24 DIAGNOSIS — Z1389 Encounter for screening for other disorder: Secondary | ICD-10-CM | POA: Diagnosis not present

## 2018-01-24 DIAGNOSIS — Z0000 Encounter for general adult medical examination without abnormal findings: Secondary | ICD-10-CM | POA: Diagnosis not present

## 2018-02-06 DIAGNOSIS — R350 Frequency of micturition: Secondary | ICD-10-CM | POA: Diagnosis not present

## 2018-02-06 DIAGNOSIS — I10 Essential (primary) hypertension: Secondary | ICD-10-CM | POA: Diagnosis not present

## 2018-02-06 DIAGNOSIS — R739 Hyperglycemia, unspecified: Secondary | ICD-10-CM | POA: Diagnosis not present

## 2018-02-08 DIAGNOSIS — R739 Hyperglycemia, unspecified: Secondary | ICD-10-CM | POA: Diagnosis not present

## 2018-02-20 ENCOUNTER — Telehealth: Payer: Self-pay | Admitting: Obstetrics & Gynecology

## 2018-02-20 DIAGNOSIS — L723 Sebaceous cyst: Secondary | ICD-10-CM

## 2018-02-20 NOTE — Telephone Encounter (Signed)
Left message to call [place] at [phone].  

## 2018-02-20 NOTE — Telephone Encounter (Signed)
Patient has a cyst that Dr. Sabra Heck was going to remove. She is ready to schedule her annual, as well, and not sure if that should be during her annual or not.

## 2018-02-21 NOTE — Telephone Encounter (Signed)
Patient returning call to [place]. Patient stated to call cell phone number, as she will not be home.

## 2018-02-21 NOTE — Addendum Note (Signed)
Addended by: Burnice Logan on: 02/21/2018 03:39 PM   Modules accepted: Orders

## 2018-02-21 NOTE — Telephone Encounter (Signed)
Left message to call Angee Gupton at [phone].  

## 2018-02-21 NOTE — Telephone Encounter (Signed)
Order placed for cyst removal.   Routing to provider for final review. Patient is agreeable to disposition. Will close encounter.  Cc: [name], [address] [employer]

## 2018-02-21 NOTE — Telephone Encounter (Signed)
Spoke with patient. Request to schedule procedure for vulvar cyst removal, seen in office on 10/29/17, discussed with Dr. Sabra Heck, desire removal. Reports no changes, "lump is still there".   Procedure for vulvar sebaceous cyst removal scheduled for 02/25/18 at 10am. AEX scheduled for 07/22/18 at 1pm with Dr. Sabra Heck. Patient verbalizes understanding.

## 2018-02-22 ENCOUNTER — Telehealth: Payer: Self-pay | Admitting: Obstetrics & Gynecology

## 2018-02-22 NOTE — Telephone Encounter (Signed)
Call placed to convey benefits. 

## 2018-02-22 NOTE — Telephone Encounter (Signed)
Patient left voicemail returning call to [place].

## 2018-02-25 ENCOUNTER — Ambulatory Visit: Payer: PPO | Admitting: Obstetrics & Gynecology

## 2018-02-25 ENCOUNTER — Encounter: Payer: Self-pay | Admitting: Obstetrics & Gynecology

## 2018-02-25 VITALS — BP 120/88 | HR 80 | Resp 16 | Ht 67.5 in | Wt 164.0 lb

## 2018-02-25 DIAGNOSIS — L723 Sebaceous cyst: Secondary | ICD-10-CM

## 2018-02-25 NOTE — Progress Notes (Signed)
GYNECOLOGY  VISIT  CC:  Removal of sebaceous cyst  HPI: 83 y.o. G33P3003 Widowed Caucasian female here for sebaceous cyst removal.  She is back from recent travels and really wants the lesion removed.  She finds it painful at times.  Denies recent tenderness.  Does not want to return for AEX.  Desires breast exam today.  GYNECOLOGIC HISTORY: No LMP recorded. Patient has had a hysterectomy. Contraception: hysterectomy  Menopausal hormone therapy: none  Patient Active Problem List   Diagnosis Date Noted  . Essential hypertension, benign 09/28/2014  . Abnormal EKG 05/18/2014  . Preop cardiovascular exam 05/18/2014  . Other and unspecified ovarian cyst 10/15/2013  . Osteopenia     Past Medical History:  Diagnosis Date  . GERD (gastroesophageal reflux disease)   . History of kidney stones   . Hypertension   . Mild dietary indigestion   . Osteopenia 06/2008  . Pneumonia 2009  . PONV (postoperative nausea and vomiting)     Past Surgical History:  Procedure Laterality Date  . APPENDECTOMY    . BLADDER SUSPENSION    . CHOLECYSTECTOMY    . COLONOSCOPY    . COLONOSCOPY WITH PROPOFOL N/A 08/24/2015   Procedure: COLONOSCOPY WITH PROPOFOL;  Surgeon: Garlan Fair, MD;  [facility];  Service: Endoscopy;  Laterality: N/A;  . CYSTOSCOPY N/A 09/28/2014   Procedure: CYSTOSCOPY ;  Surgeon: Lyman Speller, MD;  Location: [facility];  Service: Gynecology;  Laterality: N/A;  . CYSTOSCOPY WITH URETEROSCOPY AND STENT PLACEMENT Left 11/16/2015   Procedure: CYSTOSCOPY WITH Left URETEROSCOPY, Left retrograde Pyelogram, Basket STone Extraction, Left Ureteral Stent;  Surgeon: Ardis Hughs, MD;  Location: [facility];  Service: Urology;  Laterality: Left;  . EYE SURGERY Bilateral    /W IOL  . HOLMIUM LASER APPLICATION Left 0/53/9767   Procedure: HOLMIUM LASER APPLICATION;  Surgeon: Ardis Hughs, MD;  Location: [facility];  Service: Urology;  Laterality: Left;  . LAPAROSCOPIC  BILATERAL SALPINGO OOPHERECTOMY Bilateral 09/28/2014   Procedure: LAPAROSCOPIC BILATERAL SALPINGO OOPHORECTOMY; lysis of adhesions;  Surgeon: Lyman Speller, MD;  Location: [facility];  Service: Gynecology;  Laterality: Bilateral;  . VAGINAL HYSTERECTOMY     ovaries remain  . WISDOM TOOTH EXTRACTION      MEDS:   Current Outpatient Medications on File Prior to Visit  Medication Sig Dispense Refill  . losartan-hydrochlorothiazide (HYZAAR) 50-12.5 MG per tablet Take 1 tablet by mouth every morning.     . Multiple Vitamin (MULTIVITAMIN) tablet Take 1 tablet by mouth daily.    . Probiotic Product (PROBIOTIC FORMULA PO) Take by mouth.    . TURMERIC PO Take by mouth.     No current facility-administered medications on file prior to visit.     ALLERGIES: Ambien [zolpidem]; Hydrocodeine [dihydrocodeine]; and Iohexol  Family History  Problem Relation Age of Onset  . Cancer Father        ?colon ca  . Diabetes Brother   . CAD Mother 59  . Pancreatic cancer Brother     SH:  Widowed, non smoker  Review of Systems  All other systems reviewed and are negative.   PHYSICAL EXAMINATION:    BP 120/88 (BP Location: Right Arm, Patient Position: Sitting, Cuff Size: Normal)   Pulse 80   Resp 16   Ht 5' 7.5" (1.715 m)   Wt 164 lb (74.4 kg)   BMI 25.31 kg/m     Physical Exam  Constitutional: She is oriented to person, place, and time. She  appears well-developed and well-nourished.  Respiratory: Right breast exhibits no inverted nipple, no mass, no nipple discharge, no skin change and no tenderness. Left breast exhibits no inverted nipple, no mass, no nipple discharge, no skin change and no tenderness. Breasts are symmetrical.  Genitourinary:    No labial fusion. There is lesion on the right labia. There is no rash, tenderness or injury on the right labia. There is no rash, tenderness, lesion or injury on the left labia.  Lymphadenopathy:       Right: No inguinal adenopathy present.        Left: No inguinal adenopathy present.  Neurological: She is alert and oriented to person, place, and time.  Skin: Skin is warm and dry.  Psychiatric: She has a normal mood and affect.   Procedure:  Area cleansed with Betadine x 3.  1.5cc 1% Lidocaine instilled.  Using sterile technique, the skin was opened with a #11 blade.  Sebaceous material was removed.  Cyst wall was fully excised from underlying tissue.  Silver nitrate used for excellent hemostasis.  No tissue sent to pathology.  Pt tolerated procedure well.  No dressing was applied.    Chaperone was present for exam.  Assessment: S/p excision of 0.5cm sebaceous cyst from right labia majora  Plan: Cleaning and care instructions reviewed.  She can start using Neosporin on the lesion in two days.  Pt knows to call for follow up if has any concerns about healing.

## 2018-03-14 DIAGNOSIS — C4441 Basal cell carcinoma of skin of scalp and neck: Secondary | ICD-10-CM | POA: Diagnosis not present

## 2018-03-14 DIAGNOSIS — Z85828 Personal history of other malignant neoplasm of skin: Secondary | ICD-10-CM | POA: Diagnosis not present

## 2018-05-27 DIAGNOSIS — L237 Allergic contact dermatitis due to plants, except food: Secondary | ICD-10-CM | POA: Diagnosis not present

## 2018-05-27 DIAGNOSIS — Z85828 Personal history of other malignant neoplasm of skin: Secondary | ICD-10-CM | POA: Diagnosis not present

## 2018-07-22 ENCOUNTER — Ambulatory Visit: Payer: Self-pay | Admitting: Obstetrics & Gynecology

## 2018-08-13 DIAGNOSIS — R152 Fecal urgency: Secondary | ICD-10-CM | POA: Diagnosis not present

## 2018-08-13 DIAGNOSIS — G4700 Insomnia, unspecified: Secondary | ICD-10-CM | POA: Diagnosis not present

## 2018-08-13 DIAGNOSIS — M542 Cervicalgia: Secondary | ICD-10-CM | POA: Diagnosis not present

## 2018-08-13 DIAGNOSIS — I10 Essential (primary) hypertension: Secondary | ICD-10-CM | POA: Diagnosis not present

## 2018-08-13 DIAGNOSIS — Z23 Encounter for immunization: Secondary | ICD-10-CM | POA: Diagnosis not present

## 2018-08-26 DIAGNOSIS — H35372 Puckering of macula, left eye: Secondary | ICD-10-CM | POA: Diagnosis not present

## 2018-08-26 DIAGNOSIS — H524 Presbyopia: Secondary | ICD-10-CM | POA: Diagnosis not present

## 2018-10-03 DIAGNOSIS — M25561 Pain in right knee: Secondary | ICD-10-CM | POA: Insufficient documentation

## 2018-10-04 DIAGNOSIS — M2392 Unspecified internal derangement of left knee: Secondary | ICD-10-CM | POA: Diagnosis not present

## 2018-10-04 DIAGNOSIS — M25561 Pain in right knee: Secondary | ICD-10-CM | POA: Diagnosis not present

## 2018-10-04 DIAGNOSIS — M25562 Pain in left knee: Secondary | ICD-10-CM | POA: Diagnosis not present

## 2018-10-04 DIAGNOSIS — M238X2 Other internal derangements of left knee: Secondary | ICD-10-CM | POA: Diagnosis not present

## 2018-11-27 DIAGNOSIS — Z1231 Encounter for screening mammogram for malignant neoplasm of breast: Secondary | ICD-10-CM | POA: Diagnosis not present

## 2019-02-03 DIAGNOSIS — Z0000 Encounter for general adult medical examination without abnormal findings: Secondary | ICD-10-CM | POA: Diagnosis not present

## 2019-02-03 DIAGNOSIS — Z13220 Encounter for screening for lipoid disorders: Secondary | ICD-10-CM | POA: Diagnosis not present

## 2019-02-03 DIAGNOSIS — Z1389 Encounter for screening for other disorder: Secondary | ICD-10-CM | POA: Diagnosis not present

## 2019-02-03 DIAGNOSIS — I10 Essential (primary) hypertension: Secondary | ICD-10-CM | POA: Diagnosis not present

## 2019-02-05 DIAGNOSIS — Z136 Encounter for screening for cardiovascular disorders: Secondary | ICD-10-CM | POA: Diagnosis not present

## 2019-02-05 DIAGNOSIS — I10 Essential (primary) hypertension: Secondary | ICD-10-CM | POA: Diagnosis not present

## 2019-02-12 DIAGNOSIS — D1801 Hemangioma of skin and subcutaneous tissue: Secondary | ICD-10-CM | POA: Diagnosis not present

## 2019-02-12 DIAGNOSIS — Z85828 Personal history of other malignant neoplasm of skin: Secondary | ICD-10-CM | POA: Diagnosis not present

## 2019-02-12 DIAGNOSIS — L570 Actinic keratosis: Secondary | ICD-10-CM | POA: Diagnosis not present

## 2019-02-12 DIAGNOSIS — L814 Other melanin hyperpigmentation: Secondary | ICD-10-CM | POA: Diagnosis not present

## 2019-02-12 DIAGNOSIS — L821 Other seborrheic keratosis: Secondary | ICD-10-CM | POA: Diagnosis not present

## 2019-02-12 DIAGNOSIS — L723 Sebaceous cyst: Secondary | ICD-10-CM | POA: Diagnosis not present

## 2019-02-25 DIAGNOSIS — K219 Gastro-esophageal reflux disease without esophagitis: Secondary | ICD-10-CM | POA: Diagnosis not present

## 2019-03-11 DIAGNOSIS — K219 Gastro-esophageal reflux disease without esophagitis: Secondary | ICD-10-CM | POA: Diagnosis not present

## 2019-07-03 DIAGNOSIS — Z23 Encounter for immunization: Secondary | ICD-10-CM | POA: Diagnosis not present

## 2019-07-08 DIAGNOSIS — M79641 Pain in right hand: Secondary | ICD-10-CM | POA: Diagnosis not present

## 2019-07-08 DIAGNOSIS — S62646A Nondisplaced fracture of proximal phalanx of right little finger, initial encounter for closed fracture: Secondary | ICD-10-CM | POA: Diagnosis not present

## 2019-07-08 DIAGNOSIS — M79642 Pain in left hand: Secondary | ICD-10-CM | POA: Diagnosis not present

## 2019-07-15 DIAGNOSIS — S62346D Nondisplaced fracture of base of fifth metacarpal bone, right hand, subsequent encounter for fracture with routine healing: Secondary | ICD-10-CM | POA: Diagnosis not present

## 2019-07-29 DIAGNOSIS — S62346D Nondisplaced fracture of base of fifth metacarpal bone, right hand, subsequent encounter for fracture with routine healing: Secondary | ICD-10-CM | POA: Diagnosis not present

## 2019-08-05 DIAGNOSIS — I10 Essential (primary) hypertension: Secondary | ICD-10-CM | POA: Diagnosis not present

## 2019-08-12 DIAGNOSIS — S62346D Nondisplaced fracture of base of fifth metacarpal bone, right hand, subsequent encounter for fracture with routine healing: Secondary | ICD-10-CM | POA: Diagnosis not present

## 2019-08-26 DIAGNOSIS — S62346D Nondisplaced fracture of base of fifth metacarpal bone, right hand, subsequent encounter for fracture with routine healing: Secondary | ICD-10-CM | POA: Diagnosis not present

## 2019-09-03 DIAGNOSIS — H35372 Puckering of macula, left eye: Secondary | ICD-10-CM | POA: Diagnosis not present

## 2019-09-03 DIAGNOSIS — H52203 Unspecified astigmatism, bilateral: Secondary | ICD-10-CM | POA: Diagnosis not present

## 2019-09-03 DIAGNOSIS — Z961 Presence of intraocular lens: Secondary | ICD-10-CM | POA: Diagnosis not present

## 2019-10-10 DIAGNOSIS — H2000 Unspecified acute and subacute iridocyclitis: Secondary | ICD-10-CM | POA: Diagnosis not present

## 2019-10-21 DIAGNOSIS — H2000 Unspecified acute and subacute iridocyclitis: Secondary | ICD-10-CM | POA: Diagnosis not present

## 2019-11-03 ENCOUNTER — Ambulatory Visit: Payer: PPO

## 2019-11-04 ENCOUNTER — Ambulatory Visit: Payer: PPO | Attending: Internal Medicine

## 2019-11-04 DIAGNOSIS — Z23 Encounter for immunization: Secondary | ICD-10-CM | POA: Insufficient documentation

## 2019-11-04 NOTE — Progress Notes (Signed)
   Covid-19 Vaccination Clinic  Name:  [name]    MRN: [id number] DOB: September 29, 1935  11/04/2019  [name] was observed post Covid-19 immunization for 15 minutes without incidence. She was provided with Vaccine Information Sheet and instruction to access the V-Safe system.   [name] was instructed to call 911 with any severe reactions post vaccine: Marland Kitchen Difficulty breathing  . Swelling of your face and throat  . A fast heartbeat  . A bad rash all over your body  . Dizziness and weakness    Immunizations Administered    Name Date Dose VIS Date Route   Pfizer COVID-19 Vaccine 11/04/2019  1:38 PM 0.3 mL 09/26/2019 Intramuscular   Manufacturer: [employer], [employer]   Lot: [id number]   [place]: [id number]

## 2019-11-24 ENCOUNTER — Ambulatory Visit: Payer: PPO | Attending: Internal Medicine

## 2019-11-24 DIAGNOSIS — Z23 Encounter for immunization: Secondary | ICD-10-CM

## 2019-11-24 NOTE — Progress Notes (Signed)
   Covid-19 Vaccination Clinic  Name:  [name]    MRN: [id number] DOB: 10/08/1935  11/24/2019  [name] was observed post Covid-19 immunization for 15 minutes without incidence. She was provided with Vaccine Information Sheet and instruction to access the V-Safe system.   [name] was instructed to call 911 with any severe reactions post vaccine: Marland Kitchen Difficulty breathing  . Swelling of your face and throat  . A fast heartbeat  . A bad rash all over your body  . Dizziness and weakness    Immunizations Administered    Name Date Dose VIS Date Route   Pfizer COVID-19 Vaccine 11/24/2019  2:12 PM 0.3 mL 09/26/2019 Intramuscular   Manufacturer: [place]   Lot: [id number]   [place]: [id number]

## 2019-12-03 DIAGNOSIS — Z1231 Encounter for screening mammogram for malignant neoplasm of breast: Secondary | ICD-10-CM | POA: Diagnosis not present

## 2020-01-12 DIAGNOSIS — M25552 Pain in left hip: Secondary | ICD-10-CM | POA: Insufficient documentation

## 2020-01-16 DIAGNOSIS — M25552 Pain in left hip: Secondary | ICD-10-CM | POA: Diagnosis not present

## 2020-01-16 DIAGNOSIS — M7062 Trochanteric bursitis, left hip: Secondary | ICD-10-CM | POA: Diagnosis not present

## 2020-02-05 DIAGNOSIS — I10 Essential (primary) hypertension: Secondary | ICD-10-CM | POA: Diagnosis not present

## 2020-02-05 DIAGNOSIS — Z1389 Encounter for screening for other disorder: Secondary | ICD-10-CM | POA: Diagnosis not present

## 2020-02-05 DIAGNOSIS — F5104 Psychophysiologic insomnia: Secondary | ICD-10-CM | POA: Diagnosis not present

## 2020-02-05 DIAGNOSIS — Z0000 Encounter for general adult medical examination without abnormal findings: Secondary | ICD-10-CM | POA: Diagnosis not present

## 2020-02-27 DIAGNOSIS — M25562 Pain in left knee: Secondary | ICD-10-CM | POA: Diagnosis not present

## 2020-02-27 DIAGNOSIS — M25552 Pain in left hip: Secondary | ICD-10-CM | POA: Diagnosis not present

## 2020-03-18 DIAGNOSIS — M7062 Trochanteric bursitis, left hip: Secondary | ICD-10-CM | POA: Diagnosis not present

## 2020-03-18 DIAGNOSIS — M25552 Pain in left hip: Secondary | ICD-10-CM | POA: Diagnosis not present

## 2020-03-18 DIAGNOSIS — M25562 Pain in left knee: Secondary | ICD-10-CM | POA: Diagnosis not present

## 2020-04-07 DIAGNOSIS — L570 Actinic keratosis: Secondary | ICD-10-CM | POA: Diagnosis not present

## 2020-04-07 DIAGNOSIS — L918 Other hypertrophic disorders of the skin: Secondary | ICD-10-CM | POA: Diagnosis not present

## 2020-04-07 DIAGNOSIS — L821 Other seborrheic keratosis: Secondary | ICD-10-CM | POA: Diagnosis not present

## 2020-04-07 DIAGNOSIS — L820 Inflamed seborrheic keratosis: Secondary | ICD-10-CM | POA: Diagnosis not present

## 2020-04-07 DIAGNOSIS — D1801 Hemangioma of skin and subcutaneous tissue: Secondary | ICD-10-CM | POA: Diagnosis not present

## 2020-04-07 DIAGNOSIS — L814 Other melanin hyperpigmentation: Secondary | ICD-10-CM | POA: Diagnosis not present

## 2020-04-07 DIAGNOSIS — Z85828 Personal history of other malignant neoplasm of skin: Secondary | ICD-10-CM | POA: Diagnosis not present

## 2020-06-23 DIAGNOSIS — R197 Diarrhea, unspecified: Secondary | ICD-10-CM | POA: Diagnosis not present

## 2020-06-23 DIAGNOSIS — Z20822 Contact with and (suspected) exposure to covid-19: Secondary | ICD-10-CM | POA: Diagnosis not present

## 2020-07-13 DIAGNOSIS — Z1159 Encounter for screening for other viral diseases: Secondary | ICD-10-CM | POA: Diagnosis not present

## 2020-07-26 DIAGNOSIS — I10 Essential (primary) hypertension: Secondary | ICD-10-CM | POA: Diagnosis not present

## 2020-07-26 DIAGNOSIS — Z23 Encounter for immunization: Secondary | ICD-10-CM | POA: Diagnosis not present

## 2020-08-26 DIAGNOSIS — M7062 Trochanteric bursitis, left hip: Secondary | ICD-10-CM | POA: Diagnosis not present

## 2020-08-28 DIAGNOSIS — J209 Acute bronchitis, unspecified: Secondary | ICD-10-CM | POA: Diagnosis not present

## 2020-08-28 DIAGNOSIS — R059 Cough, unspecified: Secondary | ICD-10-CM | POA: Diagnosis not present

## 2020-08-29 DIAGNOSIS — R059 Cough, unspecified: Secondary | ICD-10-CM | POA: Insufficient documentation

## 2020-10-29 DIAGNOSIS — M7062 Trochanteric bursitis, left hip: Secondary | ICD-10-CM | POA: Diagnosis not present

## 2020-10-29 DIAGNOSIS — M25552 Pain in left hip: Secondary | ICD-10-CM | POA: Diagnosis not present

## 2020-11-22 ENCOUNTER — Ambulatory Visit: Payer: PPO | Admitting: Podiatry

## 2020-11-22 ENCOUNTER — Other Ambulatory Visit: Payer: Self-pay

## 2020-11-22 ENCOUNTER — Encounter: Payer: Self-pay | Admitting: Podiatry

## 2020-11-22 DIAGNOSIS — M79672 Pain in left foot: Secondary | ICD-10-CM | POA: Diagnosis not present

## 2020-11-22 DIAGNOSIS — I10 Essential (primary) hypertension: Secondary | ICD-10-CM | POA: Insufficient documentation

## 2020-11-22 DIAGNOSIS — Q828 Other specified congenital malformations of skin: Secondary | ICD-10-CM | POA: Diagnosis not present

## 2020-11-29 NOTE — Progress Notes (Signed)
Subjective:   Patient ID: [name], female   DOB: 86 y.o.   MRN: [id number]   HPI 86 year old female presents the office today for concerns of a painful callus, possible wart to left foot submetatarsal 1.  There has been ongoing for some time which appears to be getting pedicures but not able to do so recently.  Denies any open sores, drainage denies stepping on any foreign objects.  No swelling or redness.  She has no other concerns.   Review of Systems  All other systems reviewed and are negative.  Past Medical History:  Diagnosis Date  . GERD (gastroesophageal reflux disease)   . History of kidney stones   . Hypertension   . Mild dietary indigestion   . Osteopenia 06/2008  . Pneumonia 2009  . PONV (postoperative nausea and vomiting)     Past Surgical History:  Procedure Laterality Date  . APPENDECTOMY    . BLADDER SUSPENSION    . CHOLECYSTECTOMY    . COLONOSCOPY    . COLONOSCOPY WITH PROPOFOL N/A 08/24/2015   Procedure: COLONOSCOPY WITH PROPOFOL;  Surgeon: Garlan Fair, MD;  [facility]: [facility];  Service: Endoscopy;  Laterality: N/A;  . CYSTOSCOPY N/A 09/28/2014   Procedure: CYSTOSCOPY ;  Surgeon: Lyman Speller, MD;  Location: [facility];  Service: Gynecology;  Laterality: N/A;  . CYSTOSCOPY WITH URETEROSCOPY AND STENT PLACEMENT Left 11/16/2015   Procedure: CYSTOSCOPY WITH Left URETEROSCOPY, Left retrograde Pyelogram, Basket STone Extraction, Left Ureteral Stent;  Surgeon: Ardis Hughs, MD;  Location: [facility];  Service: Urology;  Laterality: Left;  . EYE SURGERY Bilateral    /W IOL  . HOLMIUM LASER APPLICATION Left 0/38/3338   Procedure: HOLMIUM LASER APPLICATION;  Surgeon: Ardis Hughs, MD;  Location: [facility];  Service: Urology;  Laterality: Left;  . LAPAROSCOPIC BILATERAL SALPINGO OOPHERECTOMY Bilateral 09/28/2014   Procedure: LAPAROSCOPIC BILATERAL SALPINGO OOPHORECTOMY; lysis of adhesions;  Surgeon: Lyman Speller, MD;  Location: [facility];   Service: Gynecology;  Laterality: Bilateral;  . VAGINAL HYSTERECTOMY     ovaries remain  . WISDOM TOOTH EXTRACTION       Current Outpatient Medications:  .  benzonatate (TESSALON) 200 MG capsule, Take 200 mg by mouth 3 (three) times daily., Disp: , Rfl:  .  chlorhexidine (PERIDEX) 0.12 % solution, SMARTSIG:1 Capful(s) By Mouth, Disp: , Rfl:  .  cyclobenzaprine (FLEXERIL) 10 MG tablet, Take 10 mg by mouth every 8 (eight) hours as needed., Disp: , Rfl:  .  LOSARTAN POTASSIUM PO, losartan Take No date recorded No form recorded No frequency recorded No route recorded No set duration recorded No set duration amount recorded active No dosage strength recorded No dosage strength units of measure recorded, Disp: , Rfl:  .  losartan-hydrochlorothiazide (HYZAAR) 50-12.5 MG per tablet, Take 1 tablet by mouth every morning. , Disp: , Rfl:  .  Multiple Vitamin (MULTIVITAMIN) tablet, Take 1 tablet by mouth daily., Disp: , Rfl:  .  pantoprazole (PROTONIX) 40 MG tablet, Take 40 mg by mouth daily., Disp: , Rfl:  .  predniSONE (DELTASONE) 20 MG tablet, Take 40 mg by mouth daily., Disp: , Rfl:  .  Probiotic Product (PROBIOTIC FORMULA PO), Take by mouth., Disp: , Rfl:  .  TURMERIC PO, Take by mouth., Disp: , Rfl:   Allergies  Allergen Reactions  . Ambien [Zolpidem] Other (See Comments)    hyper  . Hydrocodeine [Dihydrocodeine] Other (See Comments)  . Iohexol Hives    Patient needs 13  hour prep//rls         Objective:  Physical Exam  General: NAD  Dermatological: Along the left foot submetatarsal 1 is a punctate annular hyperkeratotic lesion.  Upon debridement there is no underlying ulceration drainage or signs of infection there is no evidence of puncture wound or foreign body.  No edema, erythema.  No fluctuation or crepitation.  Vascular: Dorsalis Pedis artery and Posterior Tibial artery pedal pulses are 2/4 bilateral with immedate capillary fill time. There is no pain with calf compression,  swelling, warmth, erythema.   Neruologic: Grossly intact via light touch bilateral.  Musculoskeletal: Prominence of metatarsal heads plantarly with atrophy of the fat pad muscular strength 5/5 in all groups tested bilateral.  Gait: Unassisted, Nonantalgic.       Assessment:   86 year old female with porokeratosis left foot     Plan:  -Treatment options discussed including all alternatives, risks, and complications -Etiology of symptoms were discussed -Sharply debrided hyperkeratotic lesion without any complications or bleeding.  Recommend moisturizer and offloading daily.  Trula Slade DPM

## 2020-12-10 DIAGNOSIS — Z1231 Encounter for screening mammogram for malignant neoplasm of breast: Secondary | ICD-10-CM | POA: Diagnosis not present

## 2021-01-27 DIAGNOSIS — Z20822 Contact with and (suspected) exposure to covid-19: Secondary | ICD-10-CM | POA: Diagnosis not present

## 2021-01-27 DIAGNOSIS — Z03818 Encounter for observation for suspected exposure to other biological agents ruled out: Secondary | ICD-10-CM | POA: Diagnosis not present

## 2021-02-07 DIAGNOSIS — Z0000 Encounter for general adult medical examination without abnormal findings: Secondary | ICD-10-CM | POA: Diagnosis not present

## 2021-02-07 DIAGNOSIS — R413 Other amnesia: Secondary | ICD-10-CM | POA: Diagnosis not present

## 2021-02-07 DIAGNOSIS — I10 Essential (primary) hypertension: Secondary | ICD-10-CM | POA: Diagnosis not present

## 2021-02-07 DIAGNOSIS — Z1389 Encounter for screening for other disorder: Secondary | ICD-10-CM | POA: Diagnosis not present

## 2021-02-07 DIAGNOSIS — R159 Full incontinence of feces: Secondary | ICD-10-CM | POA: Diagnosis not present

## 2021-02-17 DIAGNOSIS — J069 Acute upper respiratory infection, unspecified: Secondary | ICD-10-CM | POA: Diagnosis not present

## 2021-03-17 DIAGNOSIS — M25552 Pain in left hip: Secondary | ICD-10-CM | POA: Diagnosis not present

## 2021-03-17 DIAGNOSIS — M7062 Trochanteric bursitis, left hip: Secondary | ICD-10-CM | POA: Diagnosis not present

## 2021-03-29 DIAGNOSIS — Z20828 Contact with and (suspected) exposure to other viral communicable diseases: Secondary | ICD-10-CM | POA: Diagnosis not present

## 2021-03-29 DIAGNOSIS — Z20822 Contact with and (suspected) exposure to covid-19: Secondary | ICD-10-CM | POA: Diagnosis not present

## 2021-03-29 DIAGNOSIS — Z03818 Encounter for observation for suspected exposure to other biological agents ruled out: Secondary | ICD-10-CM | POA: Diagnosis not present

## 2021-03-29 DIAGNOSIS — U071 COVID-19: Secondary | ICD-10-CM | POA: Diagnosis not present

## 2021-03-29 DIAGNOSIS — M791 Myalgia, unspecified site: Secondary | ICD-10-CM | POA: Diagnosis not present

## 2021-04-08 ENCOUNTER — Ambulatory Visit
Admission: RE | Admit: 2021-04-08 | Discharge: 2021-04-08 | Disposition: A | Discharge status: Home / Self Care | Payer: PPO | Source: Ambulatory Visit | Attending: Physician Assistant | Admitting: Physician Assistant

## 2021-04-08 ENCOUNTER — Other Ambulatory Visit: Payer: Self-pay | Admitting: Physician Assistant

## 2021-04-08 DIAGNOSIS — Z8616 Personal history of COVID-19: Secondary | ICD-10-CM | POA: Diagnosis not present

## 2021-04-08 DIAGNOSIS — R5383 Other fatigue: Secondary | ICD-10-CM

## 2021-04-12 DIAGNOSIS — L820 Inflamed seborrheic keratosis: Secondary | ICD-10-CM | POA: Diagnosis not present

## 2021-04-12 DIAGNOSIS — Z85828 Personal history of other malignant neoplasm of skin: Secondary | ICD-10-CM | POA: Diagnosis not present

## 2021-04-12 DIAGNOSIS — L570 Actinic keratosis: Secondary | ICD-10-CM | POA: Diagnosis not present

## 2021-05-02 ENCOUNTER — Ambulatory Visit: Payer: PPO | Admitting: Neurology

## 2021-05-25 DIAGNOSIS — H35372 Puckering of macula, left eye: Secondary | ICD-10-CM | POA: Diagnosis not present

## 2021-05-25 DIAGNOSIS — Z961 Presence of intraocular lens: Secondary | ICD-10-CM | POA: Diagnosis not present

## 2021-05-25 DIAGNOSIS — H52203 Unspecified astigmatism, bilateral: Secondary | ICD-10-CM | POA: Diagnosis not present

## 2021-07-05 DIAGNOSIS — L821 Other seborrheic keratosis: Secondary | ICD-10-CM | POA: Diagnosis not present

## 2021-07-05 DIAGNOSIS — L820 Inflamed seborrheic keratosis: Secondary | ICD-10-CM | POA: Diagnosis not present

## 2021-07-05 DIAGNOSIS — L570 Actinic keratosis: Secondary | ICD-10-CM | POA: Diagnosis not present

## 2021-07-05 DIAGNOSIS — D1801 Hemangioma of skin and subcutaneous tissue: Secondary | ICD-10-CM | POA: Diagnosis not present

## 2021-07-05 DIAGNOSIS — Z85828 Personal history of other malignant neoplasm of skin: Secondary | ICD-10-CM | POA: Diagnosis not present

## 2021-07-05 DIAGNOSIS — D224 Melanocytic nevi of scalp and neck: Secondary | ICD-10-CM | POA: Diagnosis not present

## 2021-07-08 DIAGNOSIS — Z23 Encounter for immunization: Secondary | ICD-10-CM | POA: Diagnosis not present

## 2021-07-21 DIAGNOSIS — H65191 Other acute nonsuppurative otitis media, right ear: Secondary | ICD-10-CM | POA: Diagnosis not present

## 2021-09-07 DIAGNOSIS — R079 Chest pain, unspecified: Secondary | ICD-10-CM | POA: Diagnosis not present

## 2021-12-14 DIAGNOSIS — Z85828 Personal history of other malignant neoplasm of skin: Secondary | ICD-10-CM | POA: Diagnosis not present

## 2021-12-14 DIAGNOSIS — L814 Other melanin hyperpigmentation: Secondary | ICD-10-CM | POA: Diagnosis not present

## 2021-12-14 DIAGNOSIS — L820 Inflamed seborrheic keratosis: Secondary | ICD-10-CM | POA: Diagnosis not present

## 2021-12-16 DIAGNOSIS — R159 Full incontinence of feces: Secondary | ICD-10-CM | POA: Diagnosis not present

## 2021-12-30 DIAGNOSIS — Z1231 Encounter for screening mammogram for malignant neoplasm of breast: Secondary | ICD-10-CM | POA: Diagnosis not present

## 2022-01-26 ENCOUNTER — Ambulatory Visit: Payer: PPO | Admitting: Podiatry

## 2022-01-26 DIAGNOSIS — Q828 Other specified congenital malformations of skin: Secondary | ICD-10-CM

## 2022-01-26 DIAGNOSIS — M216X9 Other acquired deformities of unspecified foot: Secondary | ICD-10-CM | POA: Diagnosis not present

## 2022-01-29 NOTE — Progress Notes (Signed)
Subjective: ?87 year old female presents the office today for concerns of painful skin lesions of both of her feet.  Denies any open sores, swelling or redness or any drainage.  No edema, erythema.  No other concerns. ? ?Objective: ?AAO x3, NAD ?DP/PT pulses palpable bilaterally, CRT less than 3 seconds ?Small punctate annular hyperkeratotic lesions noted left foot submetatarsal as well as the right foot to a lesser degree.  There is no ongoing ulceration drainage or any signs of infection.  No open lesions.  No hyperpigmentation, evidence of puncture wound or foreign body.  Prominent metatarsal heads. ?No pain with calf compression, swelling, warmth, erythema ? ?Assessment: ?Hyperkeratotic lesions, porokeratosis ? ?Plan: ?-All treatment options discussed with the patient including all alternatives, risks, complications.  ?-Sharply debrided the lesions bilaterally without any complications or bleeding as a courtesy.  Continue moisturizer, offloading as I do think that having prominent metatarsal heads is contributing to this.  Continue supportive shoe gear. ?-Patient encouraged to call the office with any questions, concerns, change in symptoms.  ? ?

## 2022-02-14 ENCOUNTER — Ambulatory Visit: Payer: PPO | Admitting: Internal Medicine

## 2022-02-14 ENCOUNTER — Encounter: Payer: Self-pay | Admitting: Internal Medicine

## 2022-02-14 VITALS — BP 126/78 | HR 65 | Ht 68.0 in | Wt 153.0 lb

## 2022-02-14 DIAGNOSIS — R198 Other specified symptoms and signs involving the digestive system and abdomen: Secondary | ICD-10-CM

## 2022-02-14 DIAGNOSIS — R159 Full incontinence of feces: Secondary | ICD-10-CM

## 2022-02-14 NOTE — Patient Instructions (Signed)
If you are age 85 or older, your body mass index should be between 23-30. Your Body mass index is 23.26 kg/m?Marland Kitchen If this is out of the aforementioned range listed, please consider follow up with your Primary Care Provider. ? ?If you are age 58 or younger, your body mass index should be between 19-25. Your Body mass index is 23.26 kg/m?Marland Kitchen If this is out of the aformentioned range listed, please consider follow up with your Primary Care Provider.  ? ?________________________________________________________ ? ?The [facility] providers would like to encourage you to use [facility] to communicate with providers for non-urgent requests or questions.  Due to long hold times on the telephone, sending your provider a message by [facility] may be a faster and more efficient way to get a response.  Please allow 48 business hours for a response.  Please remember that this is for non-urgent requests.  ?_______________________________________________________ ? ?Take 2 tablespoons of Citru[place] daily ? ?Please follow up as needed ?

## 2022-02-14 NOTE — Progress Notes (Signed)
HISTORY OF PRESENT ILLNESS: ? ?[name] is a 87 y.o. female, acquaintance of Shakim Faith and Wallis Bamberg, who is self-referred regarding irregular bowel habits and incontinence.  Previous patient of Dr. Earle Gell for multiple colonoscopies.  Most recent colonoscopy August 24, 2015.  She was found to have left-sided diverticulosis and small adenomatous colon polyps. ? ?Patient tells me that about 5 years ago, or more, she would develop sudden urgency with explosive bowel movement and incontinence.  This would occur once or twice per year.  She does notice that when eating items such as salads, she may have urgency.  She tells me that she went to [place] earlier this year.  She had more troubles with increased frequency of loose bowels and incontinence.  She was evaluated by her PCP.  Outside records requested, obtained, and reviewed.  Stool studies performed December 18, 2021 revealed evidence of E. coli species.  The panel was otherwise negative.  No parasites.  Negative for C. difficile.  Comprehensive metabolic panel was unremarkable including liver test.  Normal CBC with hemoglobin 12.7.  Patient tells me that since that time her bowel habits have reverted back to baseline.  She does take a Metamucil wafer daily.  She has had multiple children delivered vaginally.  Probable episiotomy.  She is also had bladder suspension surgery.  She is status postcholecystectomy.  She is delightful, but a fair historian. ? ?She takes pantoprazole for GERD.  This controls symptoms well.  She did have a CT scan of the abdomen and pelvis to evaluate lower abdominal pain in 2017.  No acute abnormalities of the abdomen. ? ? ? ?REVIEW OF SYSTEMS: ? ?All non-GI ROS negative unless otherwise stated in the HPI. ?Past Medical History:  ?Diagnosis Date  ? GERD (gastroesophageal reflux disease)   ? History of kidney stones   ? Hypertension   ? Mild dietary indigestion   ? Osteopenia 06/2008  ? Pneumonia 2009  ? PONV (postoperative nausea  and vomiting)   ? ? ?Past Surgical History:  ?Procedure Laterality Date  ? APPENDECTOMY    ? BLADDER SUSPENSION    ? CHOLECYSTECTOMY    ? COLONOSCOPY    ? COLONOSCOPY WITH PROPOFOL N/A 08/24/2015  ? Procedure: COLONOSCOPY WITH PROPOFOL;  Surgeon: Garlan Fair, MD;  Location: [facility];  Service: Endoscopy;  Laterality: N/A;  ? CYSTOSCOPY N/A 09/28/2014  ? Procedure: CYSTOSCOPY ;  Surgeon: Lyman Speller, MD;  Location: [facility];  Service: Gynecology;  Laterality: N/A;  ? CYSTOSCOPY WITH URETEROSCOPY AND STENT PLACEMENT Left 11/16/2015  ? Procedure: CYSTOSCOPY WITH Left URETEROSCOPY, Left retrograde Pyelogram, Basket STone Extraction, Left Ureteral Stent;  Surgeon: Ardis Hughs, MD;  Location: [facility];  Service: Urology;  Laterality: Left;  ? EYE SURGERY Bilateral   ? /W IOL  ? HOLMIUM LASER APPLICATION Left 9/62/9528  ? Procedure: HOLMIUM LASER APPLICATION;  Surgeon: Ardis Hughs, MD;  Location: [facility];  Service: Urology;  Laterality: Left;  ? LAPAROSCOPIC BILATERAL SALPINGO OOPHERECTOMY Bilateral 09/28/2014  ? Procedure: LAPAROSCOPIC BILATERAL SALPINGO OOPHORECTOMY; lysis of adhesions;  Surgeon: Lyman Speller, MD;  Location: [facility];  Service: Gynecology;  Laterality: Bilateral;  ? VAGINAL HYSTERECTOMY    ? ovaries remain  ? WISDOM TOOTH EXTRACTION    ? ? ?Social History ?IVETT LUEBBE  reports that she has never smoked. She has never used smokeless tobacco. She reports current alcohol use of about 3.0 standard drinks per week. She reports that she does not use  drugs. ? ?family history includes CAD (age of onset: 34) in her mother; Colon cancer in her father; Diabetes in her brother; Pancreatic cancer in her brother. ? ?Allergies  ?Allergen Reactions  ? Ambien [Zolpidem] Other (See Comments)  ?  hyper  ? Hydrocodeine [Dihydrocodeine] Other (See Comments)  ? Iohexol Hives  ?  Patient needs 13 hour prep//rls  ? ? ?  ? ?PHYSICAL EXAMINATION: ?Vital signs: BP 126/78   Pulse 65   Ht '5\' 8"'$   (1.727 m)   Wt 153 lb (69.4 kg)   SpO2 99%   BMI 23.26 kg/m?   ?Constitutional: generally well-appearing, no acute distress ?Psychiatric: alert and oriented x3, cooperative ?Eyes: extraocular movements intact, anicteric, conjunctiva pink ?Mouth: oral pharynx moist, no lesions ?Neck: supple no lymphadenopathy ?Cardiovascular: heart regular rate and rhythm, no murmur ?Lungs: clear to auscultation bilaterally ?Abdomen: soft, nontender, nondistended, no obvious ascites, no peritoneal signs, normal bowel sounds, no organomegaly ?Rectal: Omitted ?Extremities: no clubbing, cyanosis, or lower extremity edema bilaterally ?Skin: no lesions on visible extremities ?Neuro: No focal deficits.  Cranial nerves intact ? ?ASSESSMENT: ? ?1.  Occasional urgency with loose stools and incontinence.  Most consistent with benign spasm in a patient with diverticular disease. ?2.  History of adenomatous colon polyps.  Multiple colonoscopies.  Last examination 2016 ?3.  Diverticulosis ?4.  Recent problems with worsening bowel habits secondary to E. coli infection contracted in [place].  Resolved. ?5.  GERD.  Symptoms controlled on PPI ? ?PLAN: ? ?1.  Recommend Citrucel 1 or 2 tablespoons daily ?2.  Continue to wear protective undergarments when needed ?3.  Reflux precautions ?4.  Continue PPI ?5.  GI follow-up as needed ?Total time of 60 minutes was spent preparing to see the patient, reviewing laboratories, microbiology studies, and x-rays.  Obtaining comprehensive history, reviewing available records, performing comprehensive physical examination, counseling and educating the patient regarding her above listed issues, directing medical therapy, and documenting clinical information in the health record ? ? ? ? ?  ?

## 2022-02-15 DIAGNOSIS — Z0000 Encounter for general adult medical examination without abnormal findings: Secondary | ICD-10-CM | POA: Diagnosis not present

## 2022-02-15 DIAGNOSIS — Z1331 Encounter for screening for depression: Secondary | ICD-10-CM | POA: Diagnosis not present

## 2022-02-15 DIAGNOSIS — R197 Diarrhea, unspecified: Secondary | ICD-10-CM | POA: Diagnosis not present

## 2022-02-15 DIAGNOSIS — M8580 Other specified disorders of bone density and structure, unspecified site: Secondary | ICD-10-CM | POA: Diagnosis not present

## 2022-02-15 DIAGNOSIS — I10 Essential (primary) hypertension: Secondary | ICD-10-CM | POA: Diagnosis not present

## 2022-02-15 DIAGNOSIS — Z23 Encounter for immunization: Secondary | ICD-10-CM | POA: Diagnosis not present

## 2022-02-21 ENCOUNTER — Ambulatory Visit: Payer: PPO | Admitting: Internal Medicine

## 2022-03-01 DIAGNOSIS — M8589 Other specified disorders of bone density and structure, multiple sites: Secondary | ICD-10-CM | POA: Diagnosis not present

## 2022-03-15 DIAGNOSIS — M7062 Trochanteric bursitis, left hip: Secondary | ICD-10-CM | POA: Diagnosis not present

## 2022-03-15 DIAGNOSIS — M7061 Trochanteric bursitis, right hip: Secondary | ICD-10-CM | POA: Diagnosis not present

## 2022-03-15 DIAGNOSIS — M25551 Pain in right hip: Secondary | ICD-10-CM | POA: Diagnosis not present

## 2022-05-31 DIAGNOSIS — Z961 Presence of intraocular lens: Secondary | ICD-10-CM | POA: Diagnosis not present

## 2022-05-31 DIAGNOSIS — H524 Presbyopia: Secondary | ICD-10-CM | POA: Diagnosis not present

## 2022-05-31 DIAGNOSIS — H35372 Puckering of macula, left eye: Secondary | ICD-10-CM | POA: Diagnosis not present

## 2022-06-08 DIAGNOSIS — R5383 Other fatigue: Secondary | ICD-10-CM | POA: Diagnosis not present

## 2022-06-08 DIAGNOSIS — R6883 Chills (without fever): Secondary | ICD-10-CM | POA: Diagnosis not present

## 2022-06-08 DIAGNOSIS — Z03818 Encounter for observation for suspected exposure to other biological agents ruled out: Secondary | ICD-10-CM | POA: Diagnosis not present

## 2022-06-27 DIAGNOSIS — J029 Acute pharyngitis, unspecified: Secondary | ICD-10-CM | POA: Diagnosis not present

## 2022-06-27 DIAGNOSIS — U071 COVID-19: Secondary | ICD-10-CM | POA: Diagnosis not present

## 2022-07-10 DIAGNOSIS — L570 Actinic keratosis: Secondary | ICD-10-CM | POA: Diagnosis not present

## 2022-07-10 DIAGNOSIS — D1801 Hemangioma of skin and subcutaneous tissue: Secondary | ICD-10-CM | POA: Diagnosis not present

## 2022-07-10 DIAGNOSIS — D224 Melanocytic nevi of scalp and neck: Secondary | ICD-10-CM | POA: Diagnosis not present

## 2022-07-10 DIAGNOSIS — L814 Other melanin hyperpigmentation: Secondary | ICD-10-CM | POA: Diagnosis not present

## 2022-07-10 DIAGNOSIS — Z85828 Personal history of other malignant neoplasm of skin: Secondary | ICD-10-CM | POA: Diagnosis not present

## 2022-07-10 DIAGNOSIS — D2261 Melanocytic nevi of right upper limb, including shoulder: Secondary | ICD-10-CM | POA: Diagnosis not present

## 2022-07-10 DIAGNOSIS — L821 Other seborrheic keratosis: Secondary | ICD-10-CM | POA: Diagnosis not present

## 2022-08-24 DIAGNOSIS — R197 Diarrhea, unspecified: Secondary | ICD-10-CM | POA: Diagnosis not present

## 2022-08-24 DIAGNOSIS — I10 Essential (primary) hypertension: Secondary | ICD-10-CM | POA: Diagnosis not present

## 2022-08-24 DIAGNOSIS — M8580 Other specified disorders of bone density and structure, unspecified site: Secondary | ICD-10-CM | POA: Diagnosis not present

## 2022-09-22 DIAGNOSIS — Z23 Encounter for immunization: Secondary | ICD-10-CM | POA: Diagnosis not present

## 2022-09-25 DIAGNOSIS — Z23 Encounter for immunization: Secondary | ICD-10-CM | POA: Diagnosis not present

## 2022-11-10 DIAGNOSIS — Z85828 Personal history of other malignant neoplasm of skin: Secondary | ICD-10-CM | POA: Diagnosis not present

## 2022-11-10 DIAGNOSIS — L570 Actinic keratosis: Secondary | ICD-10-CM | POA: Diagnosis not present

## 2022-11-10 DIAGNOSIS — L821 Other seborrheic keratosis: Secondary | ICD-10-CM | POA: Diagnosis not present

## 2022-11-10 DIAGNOSIS — L309 Dermatitis, unspecified: Secondary | ICD-10-CM | POA: Diagnosis not present

## 2022-11-27 DIAGNOSIS — Z111 Encounter for screening for respiratory tuberculosis: Secondary | ICD-10-CM | POA: Diagnosis not present

## 2023-01-16 DIAGNOSIS — M25562 Pain in left knee: Secondary | ICD-10-CM | POA: Diagnosis not present

## 2023-01-18 DIAGNOSIS — Z1231 Encounter for screening mammogram for malignant neoplasm of breast: Secondary | ICD-10-CM | POA: Diagnosis not present

## 2023-02-19 ENCOUNTER — Other Ambulatory Visit: Payer: Self-pay | Admitting: Internal Medicine

## 2023-02-19 ENCOUNTER — Ambulatory Visit
Admission: RE | Admit: 2023-02-19 | Discharge: 2023-02-19 | Disposition: A | Discharge status: Home / Self Care | Payer: PPO | Source: Ambulatory Visit | Attending: Internal Medicine | Admitting: Internal Medicine

## 2023-02-19 DIAGNOSIS — Z79899 Other long term (current) drug therapy: Secondary | ICD-10-CM | POA: Diagnosis not present

## 2023-02-19 DIAGNOSIS — N3946 Mixed incontinence: Secondary | ICD-10-CM | POA: Diagnosis not present

## 2023-02-19 DIAGNOSIS — R0781 Pleurodynia: Secondary | ICD-10-CM

## 2023-02-19 DIAGNOSIS — K219 Gastro-esophageal reflux disease without esophagitis: Secondary | ICD-10-CM | POA: Diagnosis not present

## 2023-02-19 DIAGNOSIS — F5104 Psychophysiologic insomnia: Secondary | ICD-10-CM | POA: Diagnosis not present

## 2023-02-19 DIAGNOSIS — Z0000 Encounter for general adult medical examination without abnormal findings: Secondary | ICD-10-CM | POA: Diagnosis not present

## 2023-02-19 DIAGNOSIS — I10 Essential (primary) hypertension: Secondary | ICD-10-CM | POA: Diagnosis not present

## 2023-02-19 DIAGNOSIS — M8589 Other specified disorders of bone density and structure, multiple sites: Secondary | ICD-10-CM | POA: Diagnosis not present

## 2023-02-27 DIAGNOSIS — L570 Actinic keratosis: Secondary | ICD-10-CM | POA: Diagnosis not present

## 2023-02-27 DIAGNOSIS — C44722 Squamous cell carcinoma of skin of right lower limb, including hip: Secondary | ICD-10-CM | POA: Diagnosis not present

## 2023-02-27 DIAGNOSIS — Z85828 Personal history of other malignant neoplasm of skin: Secondary | ICD-10-CM | POA: Diagnosis not present

## 2023-02-27 DIAGNOSIS — L821 Other seborrheic keratosis: Secondary | ICD-10-CM | POA: Diagnosis not present

## 2023-03-26 DIAGNOSIS — D649 Anemia, unspecified: Secondary | ICD-10-CM | POA: Diagnosis not present

## 2023-04-09 ENCOUNTER — Other Ambulatory Visit: Payer: Self-pay | Admitting: Internal Medicine

## 2023-04-09 DIAGNOSIS — R0789 Other chest pain: Secondary | ICD-10-CM

## 2023-05-04 ENCOUNTER — Other Ambulatory Visit: Payer: PPO

## 2023-05-07 ENCOUNTER — Ambulatory Visit: Admission: RE | Admit: 2023-05-07 | Payer: PPO | Source: Ambulatory Visit

## 2023-05-07 DIAGNOSIS — I7121 Aneurysm of the ascending aorta, without rupture: Secondary | ICD-10-CM | POA: Diagnosis not present

## 2023-05-07 DIAGNOSIS — R0789 Other chest pain: Secondary | ICD-10-CM

## 2023-05-07 DIAGNOSIS — I700 Atherosclerosis of aorta: Secondary | ICD-10-CM | POA: Diagnosis not present

## 2023-05-11 DIAGNOSIS — R300 Dysuria: Secondary | ICD-10-CM | POA: Diagnosis not present

## 2023-05-28 ENCOUNTER — Other Ambulatory Visit: Payer: Self-pay | Admitting: Internal Medicine

## 2023-05-28 DIAGNOSIS — I7121 Aneurysm of the ascending aorta, without rupture: Secondary | ICD-10-CM

## 2023-05-28 DIAGNOSIS — L821 Other seborrheic keratosis: Secondary | ICD-10-CM | POA: Diagnosis not present

## 2023-05-28 DIAGNOSIS — Z85828 Personal history of other malignant neoplasm of skin: Secondary | ICD-10-CM | POA: Diagnosis not present

## 2023-05-29 ENCOUNTER — Telehealth: Payer: Self-pay

## 2023-05-29 MED ORDER — PREDNISONE 50 MG PO TABS: ORAL_TABLET | ORAL | 0 refills | Status: AC

## 2023-05-29 MED ORDER — DIPHENHYDRAMINE HCL 50 MG PO TABS: 50.0000 mg | ORAL_TABLET | Freq: Once | ORAL | 0 refills | Status: AC

## 2023-05-29 NOTE — Telephone Encounter (Signed)
Phone call to patient to review instructions for 13 hr prep for CT w/ contrast on 06/11/23 at 12:20PM. Prescription called into [facility]. Pt aware and verbalized understanding of instructions. Prescription:   Pt to take 50 mg of prednisone on 06/10/23 at 11:20P.M. 50 mg of prednisone on 06/11/23 at 5:30A.M., and 50 mg of prednisone on 06/11/23 at 11:20A.M. Pt is also to take 50 mg of benadryl on 06/11/23 at 11:20A.M. Please call [phone] with any questions.

## 2023-06-06 NOTE — Progress Notes (Signed)
[address]       [name] [address]             [phone]   PCP is Kirby Funk, MD (Inactive) Referring Provider is No ref. provider found  Chief Complaint: Ascending thoracic aortic aneurysm   HPI: This is an 88 year old with a past medical history of hypertension, GERD, kidney stones, osteopenia who during a CT of the chest was incidentally found to have a 4.7 cm ascending thoracic aortic aneurysm, multiple pulmonary nodules, most significant is a 3 mm right solid pulmonary nodule, and small to moderate hiatal hernia. Patient presents today to establish surveillance of this ATAA. Patient has no chest pain or shortness of breath.  Past Medical History:  Diagnosis Date   GERD (gastroesophageal reflux disease)    History of kidney stones    Hypertension    Mild dietary indigestion    Osteopenia 06/2008   PONV (postoperative nausea and vomiting)   Episode of transient global amnesia (about 2-3 years ago)  Past Surgical History:  Procedure Laterality Date   APPENDECTOMY  1952   BLADDER SUSPENSION     CHOLECYSTECTOMY     COLONOSCOPY     COLONOSCOPY WITH PROPOFOL N/A 08/24/2015   Procedure: COLONOSCOPY WITH PROPOFOL;  Surgeon: Charolett Bumpers, MD;  Location: [facility];  Service: Endoscopy;  Laterality: N/A;   CYSTOSCOPY N/A 09/28/2014   Procedure: CYSTOSCOPY ;  Surgeon: Annamaria Boots, MD;  Location: [facility];  Service: Gynecology;  Laterality: N/A;   CYSTOSCOPY WITH URETEROSCOPY AND STENT PLACEMENT Left 11/16/2015   Procedure: CYSTOSCOPY WITH Left URETEROSCOPY, Left retrograde Pyelogram, Basket STone Extraction, Left Ureteral Stent;  Surgeon: Crist Fat, MD;  Location: [facility];  Service: Urology;  Laterality: Left;   EYE SURGERY Bilateral    /W IOL   HOLMIUM LASER APPLICATION Left 11/16/2015   Procedure: HOLMIUM LASER APPLICATION;  Surgeon: Crist Fat, MD;  Location: [facility];  Service: Urology;  Laterality: Left;   LAPAROSCOPIC  BILATERAL SALPINGO OOPHERECTOMY Bilateral 09/28/2014   Procedure: LAPAROSCOPIC BILATERAL SALPINGO OOPHORECTOMY; lysis of adhesions;  Surgeon: Annamaria Boots, MD;  Location: [facility];  Service: Gynecology;  Laterality: Bilateral;   VAGINAL HYSTERECTOMY  1968   ovaries remain   WISDOM TOOTH EXTRACTION      Family History  Problem Relation Age of Onset   CAD Mother 77   Colon cancer Father    Diabetes Brother    Pancreatic cancer Brother   Father died of stroke  Social History Social History   Tobacco Use   Smoking status: Never   Smokeless tobacco: Never  Vaping Use   Vaping status: Never Used  Substance Use Topics   Alcohol use: Yes    Alcohol/week: 3.0 standard drinks of alcohol    Types: 3 Standard drinks or equivalent per week    Comment: occas. wine   Drug use: No    Current Outpatient Medications  Medication Sig Dispense Refill   diphenhydrAMINE (BENADRYL) 50 MG tablet Take 1 tablet (50 mg total) by mouth once for 1 dose. Take 1 tablet (50mg  total) on 06/11/23 at 11:20A.M with last does of Prednisone 50mg . 1 tablet 0   losartan (COZAAR) 25 MG tablet daily.     losartan-hydrochlorothiazide (HYZAAR) 50-12.5 MG per tablet Take 1 tablet by mouth every morning.      Multiple Vitamin (MULTIVITAMIN) tablet Take 1 tablet by mouth daily.     pantoprazole (PROTONIX) 40 MG tablet  Take 40 mg by mouth daily as needed.     predniSONE (DELTASONE) 50 MG tablet Pt to take 50 mg of prednisone on 06/10/23 at 11:20P.M. 50 mg of prednisone on 06/11/23 at 5:30A.M., and 50 mg of prednisone on 06/11/23 at 11:20A.M. Pt is also to take 50 mg of benadryl on 06/11/23 at 11:20A.M. Please call [phone] with any questions. 3 tablet 0   TURMERIC PO Take by mouth.      Allergies  Allergen Reactions   Ambien [Zolpidem] Other (See Comments)    hyper   Hydrocodeine [Dihydrocodeine] Other (See Comments)   Iohexol Hives    Patient needs 13 hour prep//rls    Review of Systems  Chest Pain [ N ]  Exertional SOB Klaus.Mock  ]  Pedal Edema [ N ]  General Review of Systems: [Y] = yes [ N]=no  Consitutional:   nausea Klaus.Mock ];  fever [ N];  Eye : blurred vision Klaus.Mock ]; Amaurosis fugax[ N ];  Resp: cough [Y but not all of the time ];  hemoptysis[ N];  GI: vomiting[ N]; melena[N ]; hematochezia [N];  UJ:WJXBJYNWG[ N]; Heme/Lymph: anemia[ Y];  Neuro: TIA[ ] ;stroke[ ] ;  seizures[ N];  Endocrine: diabetes[ N];  Last dental visit 03/2023  Vital Signs: Vitals:   06/13/23 1305  BP: 116/73  Pulse: 85  Resp: 18  SpO2: 91%     Physical Exam: CV-RRR, no murmur Neck-No carotid bruit Pulmonary-Clear to auscultation bilaterally Abdomen-Soft, non tender, bowel sounds present Extremities-No LE edema Neurologic-Grossly intact without focal deficit   Diagnostic Tests:  Narrative & Impression  CLINICAL DATA:  Follow-up aneurysm.   EXAM: CT ANGIOGRAPHY CHEST WITH CONTRAST   TECHNIQUE: Multidetector CT imaging of the chest was performed using the standard protocol during bolus administration of intravenous contrast. Multiplanar CT image reconstructions and MIPs were obtained to evaluate the vascular anatomy.   RADIATION DOSE REDUCTION: This exam was performed according to the departmental dose-optimization program which includes automated exposure control, adjustment of the mA and/or kV according to patient size and/or use of iterative reconstruction technique.   CONTRAST:  75mL ISOVUE-370 IOPAMIDOL (ISOVUE-370) INJECTION 76%   COMPARISON:  05/14/2023.   FINDINGS: Cardiovascular: Heart is normal in size and there is no pericardial effusion. There is atherosclerotic calcification of the aorta with aneurysmal dilatation of the ascending aorta measuring 4.6 cm. No dissection is seen. The pulmonary trunk is normal in caliber.   Mediastinum/Nodes: No enlarged mediastinal, hilar, or axillary lymph nodes. Thyroid gland, trachea, and esophagus demonstrate no significant findings. There is a  moderate hiatal hernia.   Lungs/Pleura: Pleural and parenchymal thickening is noted at the lung apices bilaterally. Minimal atelectasis or scarring is present bilaterally. No effusion or pneumothorax. A 3 mm nodule is present in the left lower lobe, axial image 103. There is a stable 4 mm nodule in the right middle lobe, axial image 75. A stable 4 mm nodule is present in the right lower lobe, axial image 81. No new nodule is seen.   Upper Abdomen: The gallbladder is surgically absent. No acute abnormality.   Musculoskeletal: Degenerative changes are present in the thoracic spine. No acute osseous abnormality.   Review of the MIP images confirms the above findings.   IMPRESSION: 1. Aortic atherosclerosis with aneurysmal dilatation of the ascending aorta measuring 4.6 cm. Ascending thoracic aortic aneurysm. Recommend semi-annual imaging followup by CTA or MRA and referral to cardiothoracic surgery if not already obtained. This recommendation follows 2010 ACCF/AHA/AATS/ACR/ASA/SCA/SCAI/SIR/STS/SVM Guidelines for the Diagnosis and  Management of Patients With Thoracic Aortic Disease. Circulation. 2010; 121: Z610-R604. Aortic aneurysm NOS (ICD10-I71.9) 2. Coronary artery calcification. 3. Stable bilateral pulmonary nodules measuring up to 4 mm. Non-contrast chest CT at 3-6 months is recommended. If nodules persist and are stable at that time, consider additional non-contrast chest CT examinations at 2 and 4 years. This recommendation follows the consensus statement: Guidelines for Management of Incidental Pulmonary Nodules Detected on CT Images: From the Fleischner Society 2017; Radiology 2017; 284:228-243. 4. Moderate hiatal hernia.     Electronically Signed   By: Thornell Sartorius M.D.   On: 06/12/2023 01:42     Impression and Plan: CT of the chest without contrast done in July of this year showed a 4.7 cm ascending aortic aneurysm.  CTA done 06/11/2023 showed a 4.6 cm aneurysmal  dilatation of the ascending aorta. Also, stable bilateral pulmonary nodules measuring up to 4 mm. Non-contrast chest CT at 3-6 months is recommended. Echocardiogram may have been done sometime in the past, but patient not sure. We discussed the natural history and and risk factors for growth of ascending aortic aneurysms.  We covered the importance of smoking cessation, tight blood pressure control, refraining from lifting heavy objects, and avoiding fluoroquinolones.  We will continue surveillance and a repeat CT of chest without contrast will be ordered for 6 months. Of note, patient is relocating to [facility] soon. She will establish further surveillance there.    Ardelle Balls, PA-C [facility] [phone]

## 2023-06-08 DIAGNOSIS — H52203 Unspecified astigmatism, bilateral: Secondary | ICD-10-CM | POA: Diagnosis not present

## 2023-06-08 DIAGNOSIS — H35372 Puckering of macula, left eye: Secondary | ICD-10-CM | POA: Diagnosis not present

## 2023-06-08 DIAGNOSIS — Z961 Presence of intraocular lens: Secondary | ICD-10-CM | POA: Diagnosis not present

## 2023-06-11 ENCOUNTER — Ambulatory Visit
Admission: RE | Admit: 2023-06-11 | Discharge: 2023-06-11 | Disposition: A | Discharge status: Home / Self Care | Payer: PPO | Source: Ambulatory Visit | Attending: Internal Medicine | Admitting: Internal Medicine

## 2023-06-11 DIAGNOSIS — I7121 Aneurysm of the ascending aorta, without rupture: Secondary | ICD-10-CM | POA: Diagnosis not present

## 2023-06-11 DIAGNOSIS — R918 Other nonspecific abnormal finding of lung field: Secondary | ICD-10-CM | POA: Diagnosis not present

## 2023-06-11 DIAGNOSIS — I2510 Atherosclerotic heart disease of native coronary artery without angina pectoris: Secondary | ICD-10-CM | POA: Diagnosis not present

## 2023-06-11 DIAGNOSIS — K449 Diaphragmatic hernia without obstruction or gangrene: Secondary | ICD-10-CM | POA: Diagnosis not present

## 2023-06-11 MED ORDER — IOPAMIDOL (ISOVUE-370) INJECTION 76%
200.0000 mL | Freq: Once | INTRAVENOUS | Status: AC | PRN
  Administered 2023-06-11: 75 mL via INTRAVENOUS

## 2023-06-13 ENCOUNTER — Institutional Professional Consult (permissible substitution): Payer: PPO | Admitting: Physician Assistant

## 2023-06-13 VITALS — BP 116/73 | HR 85 | Resp 18 | Ht 68.0 in | Wt 161.0 lb

## 2023-06-13 DIAGNOSIS — I7121 Aneurysm of the ascending aorta, without rupture: Secondary | ICD-10-CM | POA: Diagnosis not present

## 2023-06-13 MED ORDER — LOSARTAN POTASSIUM 25 MG PO TABS: 25.0000 mg | ORAL_TABLET | Freq: Every day | ORAL | Status: AC

## 2023-06-13 NOTE — Patient Instructions (Addendum)
Risk Modification in those with ascending thoracic aortic aneurysm:  Continue good control of blood pressure (prefer SBP 130/80 or less)-Continue Cozaar  2. Avoid fluoroquinolone antibiotics (I.e Ciprofloxacin, Avelox, Levofloxacin, Ofloxacin)  3.  Use of statin (to decrease cardiovascular risk)-I do not have recent lipid panel available. Will defer to primary if needed.  4.  Exercise and activity limitations is individualized, but in general, contact sports are to be avoided and one should avoid heavy lifting (defined as half of ideal body weight) and exercises involving sustained Valsalva maneuver.  5. Counseling for those suspected of having genetically mediated disease. First-degree relatives of those with TAA disease should be screened as well as those who have a connective tissue disease (I.e with Marfan syndrome, Ehlers-Danlos syndrome,  and Loeys-Dietz syndrome) or a  bicuspid aortic valve,have an increased risk for complications related to TAA. Patient with no family history of connective tissue disease. Unsure if echo done but never told she had a valve problem  6. She has no history of tobacco use

## 2023-08-13 ENCOUNTER — Ambulatory Visit: Payer: Medicare Other | Admitting: Podiatry

## 2023-08-13 DIAGNOSIS — M79675 Pain in left toe(s): Secondary | ICD-10-CM | POA: Diagnosis not present

## 2023-08-13 DIAGNOSIS — L84 Corns and callosities: Secondary | ICD-10-CM | POA: Diagnosis not present

## 2023-08-13 DIAGNOSIS — M79674 Pain in right toe(s): Secondary | ICD-10-CM

## 2023-08-13 DIAGNOSIS — I739 Peripheral vascular disease, unspecified: Secondary | ICD-10-CM | POA: Diagnosis not present

## 2023-08-13 DIAGNOSIS — L603 Nail dystrophy: Secondary | ICD-10-CM | POA: Diagnosis not present

## 2023-08-13 NOTE — Progress Notes (Signed)
Subjective:  Patient ID: [name], female    DOB: 20-Dec-1934,  MRN: [id number]  [name] presents to clinic today for:  Chief Complaint  Patient presents with   Nail Problem    RFC.   Patient notes nails are thick and requests a nail trimming today.  She also has painful seed corns on plantar feet that feel like she's walking on sand / pebbles. She typically trims her own toenails at home  PCP last seen within the past 2 months.  Past Medical History:  Diagnosis Date   GERD (gastroesophageal reflux disease)    History of kidney stones    Hypertension    Mild dietary indigestion    Osteopenia 06/2008   Pneumonia 2009   PONV (postoperative nausea and vomiting)     Past Surgical History:  Procedure Laterality Date   APPENDECTOMY     BLADDER SUSPENSION     CHOLECYSTECTOMY     COLONOSCOPY     COLONOSCOPY WITH PROPOFOL N/A 08/24/2015   Procedure: COLONOSCOPY WITH PROPOFOL;  Surgeon: Charolett Bumpers, MD;  Location: [facility];  Service: Endoscopy;  Laterality: N/A;   CYSTOSCOPY N/A 09/28/2014   Procedure: CYSTOSCOPY ;  Surgeon: Annamaria Boots, MD;  Location: [facility];  Service: Gynecology;  Laterality: N/A;   CYSTOSCOPY WITH URETEROSCOPY AND STENT PLACEMENT Left 11/16/2015   Procedure: CYSTOSCOPY WITH Left URETEROSCOPY, Left retrograde Pyelogram, Basket STone Extraction, Left Ureteral Stent;  Surgeon: Crist Fat, MD;  Location: [facility];  Service: Urology;  Laterality: Left;   EYE SURGERY Bilateral    /W IOL   HOLMIUM LASER APPLICATION Left 11/16/2015   Procedure: HOLMIUM LASER APPLICATION;  Surgeon: Crist Fat, MD;  Location: [facility];  Service: Urology;  Laterality: Left;   LAPAROSCOPIC BILATERAL SALPINGO OOPHERECTOMY Bilateral 09/28/2014   Procedure: LAPAROSCOPIC BILATERAL SALPINGO OOPHORECTOMY; lysis of adhesions;  Surgeon: Annamaria Boots, MD;  Location: [facility];  Service: Gynecology;  Laterality: Bilateral;   VAGINAL HYSTERECTOMY      ovaries remain   WISDOM TOOTH EXTRACTION      Allergies  Allergen Reactions   Ambien [Zolpidem] Other (See Comments)    hyper   Hydrocodeine [Dihydrocodeine] Other (See Comments)   Iohexol Hives    Patient needs 13 hour prep//rls   Quinolones Other (See Comments)    Patient has ascending thoracic aortic aneurysm and should avoid this class of antibiotic   Review of Systems: Negative except as noted in the HPI.  Objective:  ISISS HORLICK is a pleasant 88 y.o. female in NAD. AAO x 3.  Vascular Examination: Capillary refill time is 3-5 seconds to toes bilateral.  Trace palpable pedal pulses b/l LE. Digital hair sparse b/l.  Skin temperature gradient WNL b/l. No varicosities b/l. No cyanosis noted b/l.   Dermatological Examination: Pedal skin with normal turgor, texture and tone b/l. No open wounds. No interdigital macerations b/l. Toenails x10 are dystrophic with mild thickening of the nail. There is pain with compression of the nail plates.  They are elongated x10.  There are focal hyperkeratotic lesion submet 2 bilateral, medial left second toe DIPJ, plantar left forefoot proximal to the metatarsal heads.  Assessment/Plan: 1. Nail dystrophy   2. Corns   3. PVD (peripheral vascular disease) (HCC)    The mycotic toenails were sharply debrided x10 with sterile nail nippers and a power debriding burr to decrease bulk/thickness and length.    The hyperkeratotic lesions were shaved with a sterile #[id number]  blade.  Recommended ReVitaDerm 40 cream for the corns  Return if symptoms worsen or fail to improve, for Recommended Revitaderm 40 cream for corns/calluses.   Clerance Lav, DPM, FACFAS [facility]     [address] [place], [place] [address]                Office [phone]  Fax [phone]
